# Patient Record
Sex: Male | Born: 1955
Health system: Southern US, Community
[De-identification: ages and names within clinical notes are randomized; demographics above are authoritative.]

## PROBLEM LIST (undated history)

## (undated) DIAGNOSIS — E785 Hyperlipidemia, unspecified: Secondary | ICD-10-CM

## (undated) DIAGNOSIS — K219 Gastro-esophageal reflux disease without esophagitis: Secondary | ICD-10-CM

## (undated) DIAGNOSIS — E0590 Thyrotoxicosis, unspecified without thyrotoxic crisis or storm: Secondary | ICD-10-CM

## (undated) HISTORY — PX: NO PAST SURGERIES: SHX2092

## (undated) HISTORY — DX: Hyperlipidemia, unspecified: E78.5

## (undated) HISTORY — DX: Thyrotoxicosis, unspecified without thyrotoxic crisis or storm: E05.90

## (undated) HISTORY — DX: Gastro-esophageal reflux disease without esophagitis: K21.9

---

## 2011-11-18 ENCOUNTER — Ambulatory Visit (INDEPENDENT_AMBULATORY_CARE_PROVIDER_SITE_OTHER): Payer: BC Managed Care – PPO | Admitting: Family Medicine

## 2011-11-18 VITALS — BP 114/72 | HR 60 | Temp 97.9°F | Resp 16 | Ht 67.0 in | Wt 178.0 lb

## 2011-11-18 DIAGNOSIS — E785 Hyperlipidemia, unspecified: Secondary | ICD-10-CM

## 2011-11-18 DIAGNOSIS — M2550 Pain in unspecified joint: Secondary | ICD-10-CM

## 2011-11-18 LAB — LIPID PANEL
Cholesterol: 199 mg/dL (ref 0–200)
HDL: 36 mg/dL — ABNORMAL LOW (ref >39)

## 2011-11-18 LAB — POCT SEDIMENTATION RATE: POCT SED RATE: 4 mm/hr (ref 0–22)

## 2011-11-18 MED ORDER — MELOXICAM 7.5 MG PO TABS: 7.5000 mg | ORAL_TABLET | Freq: Two times a day (BID) | ORAL | Status: DC

## 2011-11-18 NOTE — Progress Notes (Signed)
  Subjective:    Patient ID: Jason Swanson, male    DOB: Aug 20, 1956, 56 y.o.   MRN: [id number]  HPI 56 yo male here to have lipids rechecked.  Checked in Sept 2012 and were: Total 230, Trigs 201, HDL 40, LDL 150.  No other risk factors (no diabetes no hypertension) so lifestyle modification recommended and now here for recheck.   Did not change diet or activity levels.  Also complains of bilateral hand/finger joint pain for a year.  Takes occasional OTC med but can't tell me what it is.  Doesn't help.  Does labor job, does do a lot with hands.  Do not hurt all the time.  Mostly during work and especially after work.  Don't swell or turn red.  Denies joint pain in other areas.   Review of Systems    Negative except as per HPI  Objective:   Physical Exam  Constitutional: He appears well-developed and well-nourished.  Cardiovascular: Normal rate, regular rhythm, normal heart sounds and intact distal pulses.   No murmur heard. Pulmonary/Chest: Effort normal and breath sounds normal.  Neurological: He is alert.  Skin: Skin is warm and dry.   No redness, swelling, deforming, or tenderness of finger joint or hands.  Full grip strength.  FROM>        Assessment & Plan:  High Cholesterol - recheck lipids.  Joint pain - try mobic.  Check sed rate, RF, ANA

## 2012-03-25 ENCOUNTER — Ambulatory Visit (INDEPENDENT_AMBULATORY_CARE_PROVIDER_SITE_OTHER): Payer: BC Managed Care – PPO | Admitting: Family Medicine

## 2012-03-25 VITALS — BP 128/74 | HR 73 | Temp 98.8°F | Resp 16 | Ht 65.0 in | Wt 181.4 lb

## 2012-03-25 DIAGNOSIS — E782 Mixed hyperlipidemia: Secondary | ICD-10-CM

## 2012-03-25 DIAGNOSIS — Z0000 Encounter for general adult medical examination without abnormal findings: Secondary | ICD-10-CM

## 2012-03-25 DIAGNOSIS — E785 Hyperlipidemia, unspecified: Secondary | ICD-10-CM

## 2012-03-25 LAB — POCT URINALYSIS DIPSTICK
Leukocytes, UA: NEGATIVE
Nitrite, UA: NEGATIVE
Protein, UA: NEGATIVE
Spec Grav, UA: >=1.03
Urobilinogen, UA: 0.2
pH, UA: 6

## 2012-03-25 LAB — POCT CBC
HCT, POC: 47.6 % (ref 43.5–53.7)
Hemoglobin: 15.2 g/dL (ref 14.1–18.1)
Lymph, poc: 2.6 (ref 0.6–3.4)
MCH, POC: 29.6 pg (ref 27–31.2)
MCHC: 31.9 g/dL (ref 31.8–35.4)
POC LYMPH PERCENT: 37.1 %L (ref 10–50)
POC MID %: 7.3 %M (ref 0–12)
RDW, POC: 12.7 %
WBC: 7 10*3/uL (ref 4.6–10.2)

## 2012-03-25 LAB — COMPREHENSIVE METABOLIC PANEL
ALT: 17 U/L (ref 0–53)
AST: 17 U/L (ref 0–37)
Albumin: 4.7 g/dL (ref 3.5–5.2)
Alkaline Phosphatase: 56 U/L (ref 39–117)
Potassium: 3.9 mEq/L (ref 3.5–5.3)
Sodium: 141 mEq/L (ref 135–145)
Total Protein: 7.5 g/dL (ref 6.0–8.3)

## 2012-03-25 LAB — POCT UA - MICROSCOPIC ONLY
Casts, Ur, LPF, POC: NEGATIVE
Mucus, UA: NEGATIVE
Yeast, UA: NEGATIVE

## 2012-03-25 LAB — IFOBT (OCCULT BLOOD): IFOBT: NEGATIVE

## 2012-03-25 LAB — LIPID PANEL: Total CHOL/HDL Ratio: 6.5 Ratio

## 2012-03-25 NOTE — Progress Notes (Signed)
Subjective:    Patient ID: Jason Swanson, male    DOB: [birth year], 56 y.o.   MRN: [id number]  HPI Pt presents to clinic for CPE.  Needs a form for his work filled out for his [employer].  He has no complaints.  He had his tetatus updated last year.  He did not have a colonoscopy since his last visit.    See health hx - scanned.   Review of Systems  Constitutional: Negative.   HENT: Negative.   Eyes: Negative.   Respiratory: Negative.   Cardiovascular: Negative.   Gastrointestinal: Negative.   Genitourinary: Negative.   Musculoskeletal: Negative.   Skin: Negative.   Neurological: Negative.   Hematological: Negative.   Psychiatric/Behavioral: Negative.        Objective:   Physical Exam  Nursing note and vitals reviewed. Constitutional: He is oriented to person, place, and time. He appears well-developed and well-nourished.  HENT:  Head: Normocephalic and atraumatic.  Right Ear: Hearing, tympanic membrane, external ear and ear canal normal.  Left Ear: Hearing, tympanic membrane, external ear and ear canal normal.  Nose: Nose normal.  Mouth/Throat: Uvula is midline and oropharynx is clear and moist.  Eyes: Conjunctivae, EOM and lids are normal. Pupils are equal, round, and reactive to light.  Neck: Normal range of motion. Neck supple. No mass and no thyromegaly present.  Cardiovascular: Normal rate, regular rhythm, normal heart sounds and intact distal pulses.  Exam reveals no gallop and no friction rub.   No murmur heard. Pulmonary/Chest: Effort normal and breath sounds normal.  Abdominal: Soft. Normal appearance and bowel sounds are normal.  Genitourinary: Rectum normal, prostate normal, testes normal and penis normal. Circumcised. No penile tenderness.  Musculoskeletal: Normal range of motion.  Lymphadenopathy:    He has no cervical adenopathy.  Neurological: He is alert and oriented to person, place, and time. He has normal reflexes.  Skin: Skin is warm and  dry.  Psychiatric: He has a normal mood and affect. His behavior is normal. Judgment and thought content normal.    Results for orders placed in visit on 03/25/12  POCT CBC      Component Value Range   WBC 7.0  4.6 - 10.2 K/uL   Lymph, poc 2.6  0.6 - 3.4   POC LYMPH PERCENT 37.1  10 - 50 %L   MID (cbc) 0.5  0 - 0.9   POC MID % 7.3  0 - 12 %M   POC Granulocyte 3.9  2 - 6.9   Granulocyte percent 55.6  37 - 80 %G   RBC 5.13  4.69 - 6.13 M/uL   Hemoglobin 15.2  14.1 - 18.1 g/dL   HCT, POC 30.8  65.7 - 53.7 %   MCV 92.7  80 - 97 fL   MCH, POC 29.6  27 - 31.2 pg   MCHC 31.9  31.8 - 35.4 g/dL   RDW, POC 84.6     Platelet Count, POC 227  142 - 424 K/uL   MPV 10.0  0 - 99.8 fL  POCT URINALYSIS DIPSTICK      Component Value Range   Color, UA yellow     Clarity, UA clear     Glucose, UA net     Bilirubin, UA net     Ketones, UA net     Spec Grav, UA >=1.030     Blood, UA moderate     pH, UA 6.0     Protein, UA neg  Urobilinogen, UA 0.2     Nitrite, UA neg     Leukocytes, UA Negative    IFOBT (OCCULT BLOOD)      Component Value Range   IFOBT Negative      EKG - NSR without T wave changes.     Assessment & Plan:   1. Annual physical exam  POCT CBC, EKG 12-Lead, Lipid panel, Comprehensive metabolic panel, PSA, POCT urinalysis dipstick, IFOBT POC (occult bld, rslt in office)  2. Hyperlipidemia  Lipid panel, POCT urinalysis dipstick, IFOBT POC (occult bld, rslt in office)   Will schedule a colonoscopy.  Checked labs. Filled out paperwork for patient. Answered questions. Will monitor hematuria.

## 2012-03-26 ENCOUNTER — Encounter: Payer: Self-pay | Admitting: Physician Assistant

## 2012-04-21 ENCOUNTER — Ambulatory Visit (INDEPENDENT_AMBULATORY_CARE_PROVIDER_SITE_OTHER): Payer: BC Managed Care – PPO | Admitting: Emergency Medicine

## 2012-04-21 VITALS — BP 107/64 | HR 65 | Temp 97.9°F | Resp 16 | Ht 65.5 in | Wt 176.4 lb

## 2012-04-21 DIAGNOSIS — L723 Sebaceous cyst: Secondary | ICD-10-CM

## 2012-04-21 DIAGNOSIS — E781 Pure hyperglyceridemia: Secondary | ICD-10-CM

## 2012-04-21 LAB — COMPREHENSIVE METABOLIC PANEL
ALT: 15 U/L (ref 0–53)
AST: 17 U/L (ref 0–37)
Albumin: 4.6 g/dL (ref 3.5–5.2)
Alkaline Phosphatase: 55 U/L (ref 39–117)
BUN: 17 mg/dL (ref 6–23)
CO2: 26 mEq/L (ref 19–32)
Calcium: 9.4 mg/dL (ref 8.4–10.5)
Chloride: 105 mEq/L (ref 96–112)
Creat: 1.01 mg/dL (ref 0.50–1.35)
Glucose, Bld: 92 mg/dL (ref 70–99)
Potassium: 4.4 mEq/L (ref 3.5–5.3)
Sodium: 138 mEq/L (ref 135–145)
Total Bilirubin: 0.9 mg/dL (ref 0.3–1.2)
Total Protein: 7.3 g/dL (ref 6.0–8.3)

## 2012-04-21 LAB — LIPID PANEL
Cholesterol: 184 mg/dL (ref 0–200)
Total CHOL/HDL Ratio: 4.8 Ratio

## 2012-04-21 NOTE — Progress Notes (Signed)
  Subjective:    Patient ID: Jason Swanson, male    DOB: Nov 08, 1955, 56 y.o.   MRN: [id number]  HPI patient enters for recheck of high triglyceride and low hdl that was noted on his last blood draw. He has not been on medication. He has been watching his diet . He is in for repeat blood draw to he also has a cystic area on his mid back which has increased in size he would like to have checked.    Review of Systems     Objective:   Physical Exam his chest is clear to auscultation and percussion. His cardiac exam reveals a regular rate without murmurs rubs or gallops. The abdomen is soft and the liver and spleen are not enlarged There is a 2 x 2 centimeters cystic area over T12. This area is not fixed and freely movable and consistent with a sebaceous cyst       Assessment & Plan:  Repeat lipid panel and CMET are done today. I advised him we could open the cystic area on his back and we could just leave it alone

## 2012-04-21 NOTE — Progress Notes (Signed)
56 year old [place] male is here today to have his cholesterol levels checked and to have a bump that has been on his back for the last four to five months. Pt states he has no other complaints.

## 2012-08-04 ENCOUNTER — Ambulatory Visit (INDEPENDENT_AMBULATORY_CARE_PROVIDER_SITE_OTHER): Payer: BC Managed Care – PPO | Admitting: Physician Assistant

## 2012-08-04 VITALS — BP 114/68 | HR 62 | Temp 97.7°F | Resp 16 | Ht 65.58 in | Wt 179.4 lb

## 2012-08-04 DIAGNOSIS — E785 Hyperlipidemia, unspecified: Secondary | ICD-10-CM

## 2012-08-04 DIAGNOSIS — Z23 Encounter for immunization: Secondary | ICD-10-CM

## 2012-08-04 LAB — LIPID PANEL
Cholesterol: 199 mg/dL (ref 0–200)
HDL: 33 mg/dL — ABNORMAL LOW (ref >39)
LDL Cholesterol: 120 mg/dL — ABNORMAL HIGH (ref 0–99)
Total CHOL/HDL Ratio: 6 Ratio
Triglycerides: 231 mg/dL — ABNORMAL HIGH (ref <150)
VLDL: 46 mg/dL — ABNORMAL HIGH (ref 0–40)

## 2012-08-04 LAB — COMPREHENSIVE METABOLIC PANEL
ALT: 23 U/L (ref 0–53)
AST: 17 U/L (ref 0–37)
Alkaline Phosphatase: 57 U/L (ref 39–117)
BUN: 15 mg/dL (ref 6–23)
Calcium: 9.5 mg/dL (ref 8.4–10.5)
Chloride: 105 mEq/L (ref 96–112)
Creat: 0.84 mg/dL (ref 0.50–1.35)
Total Bilirubin: 1.2 mg/dL (ref 0.3–1.2)

## 2012-08-04 NOTE — Progress Notes (Addendum)
  Subjective:    Patient ID: Jason Swanson, male    DOB: Jan 01, 1956, 56 y.o.   MRN: [id number]  HPI Presents for re-evaluation of hyperlipidemia.  It was noted to be elevated in March of this year, and he's been working on lifestyle modification with some improvement.  He has never been on medication for lipid lowering.   Past Medical History  Diagnosis Date  . Hyperlipidemia     History reviewed. No pertinent past surgical history.  Prior to Admission medications   Not on File    No Known Allergies  History   Social History  . Marital Status: Married    Spouse Name: [name]    Number of Children: 6  . Years of Education: 16   Occupational History  . OPERATOR    Social History Main Topics  . Smoking status: Never Smoker   . Smokeless tobacco: Never Used  . Alcohol Use: No  . Drug Use: No  . Sexually Active: Yes -- Male partner(s)   Other Topics Concern  . Not on file   Social History Narrative   Lives with his wife and two younger sons.  Older children live in [place].  He is from [place], and came to the [place] in 2010.    Family History  Problem Relation Age of Onset  . Hypertension Father     Review of Systems Constipation. No chest pain, SOB, HA, dizziness, vision change, N/V, diarrhea, dysuria, urinary urgency or frequency, myalgias, arthralgias or rash.     Objective:   Physical Exam  Blood pressure 114/68, pulse 62, temperature 97.7 F (36.5 C), temperature source Oral, resp. rate 16, height 5' 5.58" (1.666 m), weight 179 lb 6.4 oz (81.375 kg), SpO2 99.00%. Body mass index is 29.33 kg/(m^2). Well-developed, well nourished [place] man who is awake, alert and oriented, in NAD. HEENT: [place]/AT, sclera and conjunctiva are clear.   Neck: supple, non-tender, no lymphadenopathy, thyromegaly. Heart: RRR, no murmur Lungs: normal effort, CTA Extremities: no cyanosis, clubbing or edema. Skin: warm and dry. Psychologic: good mood and appropriate  affect, normal speech and behavior.     Assessment & Plan:   1. Hyperlipidemia  Lipid panel, Comprehensive metabolic panel  2. Need for influenza vaccination  Flu vaccine greater than or equal to 3yo preservative free IM   F/U 3-6 months, depending on lab results.

## 2012-08-06 ENCOUNTER — Encounter: Payer: Self-pay | Admitting: Physician Assistant

## 2012-08-06 MED ORDER — PRAVASTATIN SODIUM 20 MG PO TABS: 20.0000 mg | ORAL_TABLET | Freq: Every day | ORAL | Status: DC

## 2012-08-06 NOTE — Addendum Note (Signed)
Addended by: Fernande Bras on: 08/06/2012 01:42 PM   Modules accepted: Orders

## 2012-10-22 ENCOUNTER — Ambulatory Visit (INDEPENDENT_AMBULATORY_CARE_PROVIDER_SITE_OTHER): Payer: BC Managed Care – PPO | Admitting: Family Medicine

## 2012-10-22 VITALS — BP 133/76 | HR 83 | Temp 98.0°F | Resp 16 | Ht 66.0 in | Wt 186.0 lb

## 2012-10-22 DIAGNOSIS — L02219 Cutaneous abscess of trunk, unspecified: Secondary | ICD-10-CM

## 2012-10-22 DIAGNOSIS — L02212 Cutaneous abscess of back [any part, except buttock]: Secondary | ICD-10-CM

## 2012-10-22 DIAGNOSIS — L723 Sebaceous cyst: Secondary | ICD-10-CM

## 2012-10-22 DIAGNOSIS — L089 Local infection of the skin and subcutaneous tissue, unspecified: Secondary | ICD-10-CM

## 2012-10-22 MED ORDER — DOXYCYCLINE HYCLATE 100 MG PO TABS: 100.0000 mg | ORAL_TABLET | Freq: Two times a day (BID) | ORAL | Status: DC

## 2012-10-22 NOTE — Progress Notes (Signed)
Consent obtained:  Local anesthesia with 2% lido with epi.  #11 blade used to make 1.5 cm incision.  Purulent sebaceous material was expressed.  Packed with 1/4 in plain packing.  Drsg placed.  Wound care d/w pt.

## 2012-10-22 NOTE — Patient Instructions (Addendum)
Take doxycycline 100 mg one twice daily for infection Change bandage daily.   Recheck with Maralyn Sago on Thursday or Friday for wound recheck.  Bring in your fast pass.

## 2012-10-22 NOTE — Progress Notes (Signed)
Subjective: 57 year old gentleman from [place] who has been in [place] for 3 years. He is working a labor job. He's developed a abscessed sebaceous cyst on his back. For 56 days has gotten worse. It has not been draining.  Objective To a half to 3 cm nodule in the mid back right overlying the spine. Inflamed and tender, coming to a head  Assessment: Abscessed sebaceous cyst  Plan: Doxycycline 100 mg twice a day I&D of cyst Culture of cyst

## 2012-12-15 ENCOUNTER — Ambulatory Visit: Payer: BC Managed Care – PPO | Admitting: Emergency Medicine

## 2012-12-15 VITALS — BP 107/62 | HR 58 | Temp 97.5°F | Resp 16 | Ht 66.0 in | Wt 183.0 lb

## 2012-12-15 DIAGNOSIS — E782 Mixed hyperlipidemia: Secondary | ICD-10-CM

## 2012-12-15 LAB — POCT CBC
Hemoglobin: 17.2 g/dL (ref 14.1–18.1)
Lymph, poc: 2.6 (ref 0.6–3.4)
MCH, POC: 29.7 pg (ref 27–31.2)
MCHC: 32.8 g/dL (ref 31.8–35.4)
MID (cbc): 0.6 (ref 0–0.9)
MPV: 9.3 fL (ref 0–99.8)
POC Granulocyte: 3.9 (ref 2–6.9)
POC LYMPH PERCENT: 36.6 %L (ref 10–50)
POC MID %: 8.1 %M (ref 0–12)
RDW, POC: 13.1 %
WBC: 7.1 10*3/uL (ref 4.6–10.2)

## 2012-12-15 LAB — COMPREHENSIVE METABOLIC PANEL
ALT: 24 U/L (ref 0–53)
Alkaline Phosphatase: 58 U/L (ref 39–117)
CO2: 28 mEq/L (ref 19–32)
Creat: 0.97 mg/dL (ref 0.50–1.35)
Sodium: 140 mEq/L (ref 135–145)
Total Bilirubin: 1.5 mg/dL — ABNORMAL HIGH (ref 0.3–1.2)
Total Protein: 8.1 g/dL (ref 6.0–8.3)

## 2012-12-15 LAB — LIPID PANEL
Cholesterol: 216 mg/dL — ABNORMAL HIGH (ref 0–200)
LDL Cholesterol: 134 mg/dL — ABNORMAL HIGH (ref 0–99)
Total CHOL/HDL Ratio: 5.3 Ratio
Triglycerides: 204 mg/dL — ABNORMAL HIGH (ref <150)
VLDL: 41 mg/dL — ABNORMAL HIGH (ref 0–40)

## 2012-12-15 NOTE — Progress Notes (Signed)
[facility] [address], [place] [address] [phone]  Date:  12/15/2012   Name:  Jason Swanson   DOB:  July 07, 1956   MRN:  [id number]  PCP:  No primary provider on file.    Chief Complaint: Hyperlipidemia   History of Present Illness:  Jason Swanson is a 57 y.o. very pleasant male patient who presents with the following:  History of hyperlipidemia and was on pravachol.  Ran out of the medication a couple weeks ago and is now off the medication and wants to know if his hyperlipidemia is "cured" or does he need another round of medication.  Denies other complaint or health concern today.   Patient Active Problem List  Diagnosis  . Hyperlipidemia    Past Medical History  Diagnosis Date  . Hyperlipidemia     History reviewed. No pertinent past surgical history.  History  Substance Use Topics  . Smoking status: Never Smoker   . Smokeless tobacco: Never Used  . Alcohol Use: No    Family History  Problem Relation Age of Onset  . Hypertension Father     No Known Allergies  Medication list has been reviewed and updated.  Current Outpatient Prescriptions on File Prior to Visit  Medication Sig Dispense Refill  . pravastatin (PRAVACHOL) 20 MG tablet Take 1 tablet (20 mg total) by mouth daily.  90 tablet  3  . doxycycline (VIBRA-TABS) 100 MG tablet Take 1 tablet (100 mg total) by mouth 2 (two) times daily.  20 tablet  0   No current facility-administered medications on file prior to visit.    Review of Systems:  As per HPI, otherwise negative.    Physical Examination: Filed Vitals:   12/15/12 1126  BP: 107/62  Pulse: 58  Temp: 97.5 F (36.4 C)  Resp: 16   Filed Vitals:   12/15/12 1126  Height: 5\' 6"  (1.676 m)  Weight: 183 lb (83.008 kg)   Body mass index is 29.55 kg/(m^2). Ideal Body Weight: Weight in (lb) to have BMI = 25: 154.6   GEN: WDWN, NAD, Non-toxic, A & O x 3 HEENT: Atraumatic, Normocephalic. Neck supple. No  masses, No LAD. Ears and Nose: No external deformity. CV: RRR, No M/G/R. No JVD. No thrill. No extra heart sounds. PULM: CTA B, no wheezes, crackles, rhonchi. No retractions. No resp. distress. No accessory muscle use. ABD: S, NT, ND, +BS. No rebound. No HSM. EXTR: No c/c/e NEURO Normal gait.  PSYCH: Normally interactive. Conversant. Not depressed or anxious appearing.  Calm demeanor.   Assessment and Plan: Hyperlipidemia Overweight   Signed,  Phillips Odor, MD

## 2012-12-15 NOTE — Patient Instructions (Addendum)

## 2012-12-16 ENCOUNTER — Other Ambulatory Visit: Payer: Self-pay | Admitting: *Deleted

## 2012-12-16 MED ORDER — PRAVASTATIN SODIUM 20 MG PO TABS: 20.0000 mg | ORAL_TABLET | Freq: Every day | ORAL | Status: DC

## 2012-12-16 NOTE — Addendum Note (Signed)
Addended by: Carmelina Dane on: 12/16/2012 08:58 AM   Modules accepted: Orders

## 2012-12-24 ENCOUNTER — Telehealth: Payer: Self-pay

## 2012-12-24 NOTE — Telephone Encounter (Signed)
Called patient what is the doxycycline for? Left message for call back, do not see any indication he needs Doxy.

## 2012-12-24 NOTE — Telephone Encounter (Signed)
Called again, what is he requesting Doxycycline for?

## 2012-12-24 NOTE — Telephone Encounter (Signed)
Went to p/u medication and only one rx was there. Needs to get doxycycline. Can you send again?  [facility] on [address]    [phone]

## 2012-12-26 NOTE — Telephone Encounter (Signed)
Patient not returning my calls he does not need doxy, this was old rx, listed on papers. This can be confusing.

## 2013-10-12 ENCOUNTER — Ambulatory Visit (INDEPENDENT_AMBULATORY_CARE_PROVIDER_SITE_OTHER): Payer: PRIVATE HEALTH INSURANCE | Admitting: Physician Assistant

## 2013-10-12 ENCOUNTER — Other Ambulatory Visit: Payer: Self-pay | Admitting: Physician Assistant

## 2013-10-12 VITALS — BP 110/68 | HR 68 | Temp 98.2°F | Resp 18 | Ht 66.0 in | Wt 184.0 lb

## 2013-10-12 DIAGNOSIS — E785 Hyperlipidemia, unspecified: Secondary | ICD-10-CM

## 2013-10-12 LAB — COMPREHENSIVE METABOLIC PANEL
ALK PHOS: 54 U/L (ref 39–117)
ALT: 22 U/L (ref 0–53)
AST: 19 U/L (ref 0–37)
Albumin: 4.6 g/dL (ref 3.5–5.2)
BILIRUBIN TOTAL: 1.5 mg/dL — AB (ref 0.2–1.2)
BUN: 14 mg/dL (ref 6–23)
CO2: 29 mEq/L (ref 19–32)
CREATININE: 1.07 mg/dL (ref 0.50–1.35)
Calcium: 9.3 mg/dL (ref 8.4–10.5)
Chloride: 104 mEq/L (ref 96–112)
GLUCOSE: 103 mg/dL — AB (ref 70–99)
Potassium: 4.2 mEq/L (ref 3.5–5.3)
SODIUM: 140 meq/L (ref 135–145)
TOTAL PROTEIN: 7.5 g/dL (ref 6.0–8.3)

## 2013-10-12 LAB — LIPID PANEL
CHOL/HDL RATIO: 4.6 ratio
Cholesterol: 157 mg/dL (ref 0–200)
HDL: 34 mg/dL — AB (ref >39)
LDL Cholesterol: 86 mg/dL (ref 0–99)
Triglycerides: 185 mg/dL — ABNORMAL HIGH (ref <150)
VLDL: 37 mg/dL (ref 0–40)

## 2013-10-12 LAB — LDL CHOLESTEROL, DIRECT: LDL DIRECT: 98 mg/dL

## 2013-10-12 MED ORDER — PRAVASTATIN SODIUM 20 MG PO TABS: 20.0000 mg | ORAL_TABLET | Freq: Every day | ORAL | Status: DC

## 2013-10-12 NOTE — Progress Notes (Signed)
   Subjective:    Patient ID: Jason Swanson, male    DOB: November 06, 1955, 58 y.o.   MRN: [id number]  HPI Primary Physician: No primary provider on file.  Chief Complaint: Follow up hyperlipidemia  HPI: 58 y.o. male with history below presents for follow up hyperlipidemia. Currently taking pravastatin 20 mg nightly. No muscle aches or body aches from the medication. Needs refill. Has been on this medication since 2013. Diet consists of mix some healthy foods and some fast foods. He does not get any exercise. His exercise consists of his work. He is trying to start some exercise routine. No family history of hyperlipidemia that he knows of.    Past Medical History  Diagnosis Date  . Hyperlipidemia      Home Meds: Prior to Admission medications   Medication Sig Start Date End Date Taking? Authorizing Provider  pravastatin (PRAVACHOL) 20 MG tablet Take 1 tablet (20 mg total) by mouth daily. 12/16/12  Yes Phillips OdorJeffery Anderson, MD    Allergies: No Known Allergies  History   Social History  . Marital Status: Married    Spouse Name: [name]    Number of Children: 6  . Years of Education: 16   Occupational History  . OPERATOR    Social History Main Topics  . Smoking status: Never Smoker   . Smokeless tobacco: Never Used  . Alcohol Use: No  . Drug Use: No  . Sexual Activity: Yes    Partners: Female   Other Topics Concern  . Not on file   Social History Narrative   Lives with his wife and two younger sons.  Older children live in [place].  He is from [place], and came to the [place] in 2010.     Review of Systems     Objective:   Physical Exam  Physical Exam: Blood pressure 110/68, pulse 68, temperature 98.2 F (36.8 C), temperature source Oral, resp. rate 18, height 5\' 6"  (1.676 m), weight 184 lb (83.462 kg), SpO2 98.00%., Body mass index is 29.71 kg/(m^2). General: Well developed, well nourished, in no acute distress. Head: Normocephalic, atraumatic, eyes without  discharge, sclera non-icteric, nares are without discharge. Bilateral auditory canals clear, TM's are without perforation, pearly grey and translucent with reflective cone of light bilaterally. Oral cavity moist, posterior pharynx without exudate, erythema, peritonsillar abscess, or post nasal drip. Uvula midline.   Neck: Supple. No thyromegaly. Full ROM. No lymphadenopathy. Lungs: Clear bilaterally to auscultation without wheezes, rales, or rhonchi. Breathing is unlabored. Heart: RRR with S1 S2. No murmurs, rubs, or gallops appreciated. Abdomen: Soft, non-tender, non-distended with normoactive bowel sounds. No hepatosplenomegaly. No rebound/guarding. No obvious abdominal masses. Msk:  Strength and tone normal for age. Extremities/Skin: Warm and dry. No clubbing or cyanosis. No edema. No rashes or suspicious lesions. Neuro: Alert and oriented X 3. Moves all extremities spontaneously. Gait is normal. CNII-XII grossly in tact. Psych:  Responds to questions appropriately with a normal affect.    Labs:  Lipid, direct LDL, and CMP pending. He is not currently fasting.      Assessment & Plan:  58 year old male with hyperlipidemia  -Await labs -Refill medication based on lipid panel -Healthy diet and exercise -Weight loss -Follow up 6 months   Eula Listenyan Tomicka Lover, MHS, PA-C [facility] [address] [place], [place] [address] [phone] [facility] 10/12/2013 8:52 AM

## 2013-12-27 ENCOUNTER — Emergency Department (HOSPITAL_COMMUNITY)
Admission: EM | Admit: 2013-12-27 | Discharge: 2013-12-27 | Disposition: A | Discharge status: Home / Self Care | Payer: Worker's Compensation | Attending: Emergency Medicine | Admitting: Emergency Medicine

## 2013-12-27 ENCOUNTER — Encounter (HOSPITAL_COMMUNITY): Payer: Self-pay | Admitting: Emergency Medicine

## 2013-12-27 DIAGNOSIS — Z79899 Other long term (current) drug therapy: Secondary | ICD-10-CM | POA: Insufficient documentation

## 2013-12-27 DIAGNOSIS — E785 Hyperlipidemia, unspecified: Secondary | ICD-10-CM | POA: Insufficient documentation

## 2013-12-27 DIAGNOSIS — IMO0002 Reserved for concepts with insufficient information to code with codable children: Secondary | ICD-10-CM | POA: Insufficient documentation

## 2013-12-27 DIAGNOSIS — X500XXA Overexertion from strenuous movement or load, initial encounter: Secondary | ICD-10-CM | POA: Insufficient documentation

## 2013-12-27 DIAGNOSIS — Y9289 Other specified places as the place of occurrence of the external cause: Secondary | ICD-10-CM | POA: Insufficient documentation

## 2013-12-27 DIAGNOSIS — Y9389 Activity, other specified: Secondary | ICD-10-CM | POA: Insufficient documentation

## 2013-12-27 DIAGNOSIS — S86119A Strain of other muscle(s) and tendon(s) of posterior muscle group at lower leg level, unspecified leg, initial encounter: Secondary | ICD-10-CM

## 2013-12-27 MED ORDER — HYDROCODONE-ACETAMINOPHEN 7.5-325 MG PO TABS: 1.0000 | ORAL_TABLET | ORAL | Status: DC | PRN

## 2013-12-27 MED ORDER — MELOXICAM 7.5 MG PO TABS: ORAL_TABLET | ORAL | Status: DC

## 2013-12-27 MED ORDER — KETOROLAC TROMETHAMINE 10 MG PO TABS
10.0000 mg | ORAL_TABLET | Freq: Once | ORAL | Status: AC
  Administered 2013-12-27: 10 mg via ORAL
  Filled 2013-12-27: qty 1

## 2013-12-27 NOTE — Discharge Instructions (Signed)
Your examination is consistent with a strain of your calf muscle. Please rest your leg is much as possible. Please elevate it and your sitting or lying down. Please apply an ice pack. Please use her crutches until you can safely apply weight to your lower extremity. Please see the orthopedic specialist listed above for additional evaluation and management of this injury. Use Mobic 2 times daily with food, use Norco every 4 hours if needed for pain, this medication may cause drowsiness, please use with caution.

## 2013-12-27 NOTE — ED Notes (Signed)
Pt states he was pushing box at work when he felt pain in L. Lower calf. States very painful when walking and tender to touch.

## 2013-12-27 NOTE — ED Notes (Signed)
Patient c/o left calf pain that started suddenly after pushing large heavy boxes at work. Patient ambulated to triage but limping noted.

## 2013-12-27 NOTE — ED Provider Notes (Signed)
CSN: [id number]     Arrival date & time 12/27/13  1346 History   First MD Initiated Contact with Patient 12/27/13 1436     Chief Complaint  Patient presents with  . Leg Pain     (Consider location/radiation/quality/duration/timing/severity/associated sxs/prior Treatment) Patient is a 58 y.o. male presenting with leg pain. The history is provided by the patient.  Leg Pain Location:  Leg Time since incident:  1 hour Injury: yes   Mechanism of injury comment:  Pt injured the left calf while pushing heavy boxes at work. NO other injury reported. Leg pain location: calf of the left leg. Pain details:    Quality:  Cramping and sharp   Radiates to:  Does not radiate   Severity:  Moderate   Onset quality:  Sudden   Duration:  1 hour   Timing:  Constant   Progression:  Worsening Chronicity:  New Dislocation: no   Foreign body present:  No foreign bodies Prior injury to area:  No Relieved by:  Nothing Ineffective treatments:  None tried Associated symptoms: no back pain, no neck pain and no numbness   Risk factors: no frequent fractures     Past Medical History  Diagnosis Date  . Hyperlipidemia    History reviewed. No pertinent past surgical history. Family History  Problem Relation Age of Onset  . Hypertension Father    History  Substance Use Topics  . Smoking status: Never Smoker   . Smokeless tobacco: Never Used  . Alcohol Use: No    Review of Systems  Constitutional: Negative for activity change.       All ROS Neg except as noted in HPI  HENT: Negative for nosebleeds.   Eyes: Negative for photophobia and discharge.  Respiratory: Negative for cough, shortness of breath and wheezing.   Cardiovascular: Negative for chest pain and palpitations.  Gastrointestinal: Negative for abdominal pain and blood in stool.  Genitourinary: Negative for dysuria, frequency and hematuria.  Musculoskeletal: Negative for arthralgias, back pain and neck pain.  Skin: Negative.    Neurological: Negative for dizziness, seizures and speech difficulty.  Psychiatric/Behavioral: Negative for hallucinations and confusion.      Allergies  Review of patient's allergies indicates no known allergies.  Home Medications   Prior to Admission medications   Medication Sig Start Date End Date Taking? Authorizing Provider  pravastatin (PRAVACHOL) 20 MG tablet Take 1 tablet (20 mg total) by mouth daily. 10/12/13  Yes Ryan M Dunn, PA-C   BP 126/74  Pulse 79  Temp(Src) 98 F (36.7 C) (Oral)  Resp 18  Ht 5\' 4"  (1.626 m)  Wt 184 lb (83.462 kg)  BMI 31.57 kg/m2  SpO2 99% Physical Exam  Nursing note and vitals reviewed. Constitutional: He is oriented to person, place, and time. He appears well-developed and well-nourished.  Non-toxic appearance.  HENT:  Head: Normocephalic.  Right Ear: Tympanic membrane and external ear normal.  Left Ear: Tympanic membrane and external ear normal.  Eyes: EOM and lids are normal. Pupils are equal, round, and reactive to light.  Neck: Normal range of motion. Neck supple. Carotid bruit is not present.  Cardiovascular: Normal rate, regular rhythm, normal heart sounds, intact distal pulses and normal pulses.   Pulmonary/Chest: Breath sounds normal. No respiratory distress.  Abdominal: Soft. Bowel sounds are normal. There is no tenderness. There is no guarding.  Musculoskeletal:       Left lower leg: He exhibits tenderness. He exhibits no swelling and no deformity.  Neg Thompson's sign -  left calf. DP 2+. Cap refill less than 2 sec. FROM of the right and left knee, and hip.  Lymphadenopathy:       Head (right side): No submandibular adenopathy present.       Head (left side): No submandibular adenopathy present.    He has no cervical adenopathy.  Neurological: He is alert and oriented to person, place, and time. He has normal strength. No cranial nerve deficit or sensory deficit.  Skin: Skin is warm and dry.  Psychiatric: He has a normal mood  and affect. His speech is normal.    ED Course  Procedures (including critical care time) Labs Review Labs Reviewed - No data to display  Imaging Review No results found.   EKG Interpretation None      MDM No evidence for fracture or dislocation of the left lower extremity. The achilles Tendon is intact. No hematoma of the left calf, or redness. No neurovascular changes noted.    Final diagnoses:  None    *I have reviewed nursing notes, vital signs, and all appropriate lab and imaging results for this patient.Kathie Dike**    Melaine Mcphee M Nolah Krenzer, PA-C 12/28/13 2019

## 2013-12-29 NOTE — ED Provider Notes (Signed)
Medical screening examination/treatment/procedure(s) were performed by non-physician practitioner and as supervising physician I was immediately available for consultation/collaboration.   EKG Interpretation None       Mackenzy Eisenberg L Nasiya Pascual, MD 12/29/13 1800 

## 2014-08-10 ENCOUNTER — Ambulatory Visit (INDEPENDENT_AMBULATORY_CARE_PROVIDER_SITE_OTHER): Payer: PRIVATE HEALTH INSURANCE | Admitting: Physician Assistant

## 2014-08-10 VITALS — BP 122/66 | HR 60 | Temp 97.8°F | Resp 18 | Ht 66.5 in | Wt 187.0 lb

## 2014-08-10 DIAGNOSIS — Z131 Encounter for screening for diabetes mellitus: Secondary | ICD-10-CM

## 2014-08-10 DIAGNOSIS — E785 Hyperlipidemia, unspecified: Secondary | ICD-10-CM

## 2014-08-10 LAB — LIPID PANEL
CHOL/HDL RATIO: 4.5 ratio
Cholesterol: 199 mg/dL (ref 0–200)
HDL: 44 mg/dL (ref >39)
LDL Cholesterol: 119 mg/dL — ABNORMAL HIGH (ref 0–99)
Triglycerides: 182 mg/dL — ABNORMAL HIGH (ref <150)
VLDL: 36 mg/dL (ref 0–40)

## 2014-08-10 LAB — GLUCOSE, POCT (MANUAL RESULT ENTRY): POC GLUCOSE: 94 mg/dL (ref 70–99)

## 2014-08-10 MED ORDER — PRAVASTATIN SODIUM 20 MG PO TABS: 20.0000 mg | ORAL_TABLET | Freq: Every day | ORAL | Status: DC

## 2014-08-10 NOTE — Progress Notes (Signed)
IDENTIFYING INFORMATION  Marva PandaGirma W Pinard / DOB: [birth year] / MRN: [id number]  The patient has Hyperlipidemia on his problem list.  SUBJECTIVE  Chief Complaint: Labs Only   History of present illness: Mr. Janeece Riggersstifanos is a 58 y.o. year old male who presents for a fasting cholesterol check and a diabetes screen.  He has no complaints today. He is fasting at this time.  He has been taking his pravastatin for roughly 1 year. He denies SOB, chest pain, palpitations, nausea, and emesis.  He would like to come off of this medication should his labs indicate this possibility.    He repots having occasional heart burn with eating sour and fatty foods, and gets good relief with Tums.   He was advised on the need for an annual screening, but declines this today d/t cost.      He  has a past medical history of Hyperlipidemia.    He has a current medication list which includes the following prescription(s): pravastatin.  Mr. Janeece Riggersstifanos has No Known Allergies. He  reports that he has never smoked. He has never used smokeless tobacco. He reports that he does not drink alcohol or use illicit drugs. He  reports that he currently engages in sexual activity and has had male partners.  The patient  has no past surgical history on file.  His family history includes Hypertension in his father.  Review of Systems  Constitutional: Negative for fever, chills and weight loss.  Eyes: Negative for blurred vision.  Respiratory: Negative for cough and wheezing.   Gastrointestinal: Negative for abdominal pain, diarrhea, constipation, blood in stool and melena.  Genitourinary: Negative for dysuria, urgency and frequency.  Musculoskeletal: Negative for myalgias.  Skin: Negative for itching and rash.  Neurological: Negative for dizziness and headaches.  Psychiatric/Behavioral: Negative for depression.    OBJECTIVE  Blood pressure 122/66, pulse 60, temperature 97.8 F (36.6 C), temperature source Oral,  resp. rate 18, height 5' 6.5" (1.689 m), weight 187 lb (84.823 kg), SpO2 99 %. The patient's body mass index is 29.73 kg/(m^2).  Physical Exam  Constitutional: He is oriented to person, place, and time. He appears well-developed and well-nourished. No distress.  Eyes: EOM are normal.  Neck: Normal range of motion.  Cardiovascular: Normal rate, regular rhythm and normal heart sounds.   Respiratory: Effort normal and breath sounds normal.  GI: Soft. Bowel sounds are normal. He exhibits no distension. There is no hepatosplenomegaly. There is no tenderness.  Musculoskeletal: Normal range of motion.  Neurological: He is alert and oriented to person, place, and time. He has normal reflexes. No cranial nerve deficit.  Skin: Skin is warm and dry. He is not diaphoretic.  Psychiatric: He has a normal mood and affect. His behavior is normal. Judgment and thought content normal.    Lab Results  Component Value Date   CHOL 199 08/10/2014   HDL 44 08/10/2014   LDLCALC 119* 08/10/2014   LDLDIRECT 98 10/12/2013   TRIG 182* 08/10/2014   CHOLHDL 4.5 08/10/2014     No results found for this or any previous visit (from the past 24 hour(s)).  ASSESSMENT & PLAN  Verne GrainGirma was seen today for labs only.  Diagnoses and associated orders for this visit:  Screening for diabetes mellitus (DM) - POCT glucose (manual entry)  Hyperlipidemia - Lipid panel -     Patient would like to decrease of stop taking pravastatin. Patient amenable to eating less sugar and eating fish twice weekly. He was educated  with regard to exercising daily.      The patient was instructed to to call or comeback to clinic as needed, or should symptoms warrant.  Deliah BostonMichael Clark, MHS, PA-C [facility] 08/11/2014 11:00 AM

## 2014-08-10 NOTE — Patient Instructions (Addendum)
Exercise improves every system in the body.  It lowers the risk of heart disease, decreases blood pressure, reduces the symptoms of depression and anxiety, and lowers blood sugar. To receive these benefits, try to get 150 minutes of planned exercise each week.  You can break this 150 minutes up however you like.  For instance, you can perform 30 minutes of brisk walking 5 days a week, or perform 50 minutes 3 days a week.  If you don't like walking, or can't find a safe place to walk, find another way to move that you can enjoy.  Exercise tapes, cycling, stair climbing, swimming, or a combination will be just as good as a walking program. To ensure the proper intensity, you can use the talk test. Essentially, you should be able to carry on a conversation, but you should have to take short breaks from the conversation in order catch your breath.  Try to eat fish twice a week, or take a fish oil product over the counter daily. Speak to the pharmacist about which would be appropriate for you.    Take Ranitidine, 75 mg before eating foods known to cause heartburn.   Keeping you healthy  Get these tests  Blood pressure- Have your blood pressure checked once a year by your healthcare provider.  Normal blood pressure is 120/80  Weight- Have your body mass index (BMI) calculated to screen for obesity.  BMI is a measure of body fat based on height and weight. You can also calculate your own BMI at [email or web address].nhl[employer]ort.com/bmi/.  Cholesterol- Have your cholesterol checked every year.  Diabetes- Have your blood sugar checked regularly if you have high blood pressure, high cholesterol, have a family history of diabetes or if you are overweight.  Screening for Colon Cancer- Colonoscopy starting at age 57.  Screening may begin sooner depending on your family history and other health conditions. Follow up colonoscopy as directed by your Gastroenterologist.  Screening for Prostate Cancer- Both blood work (PSA) and a  rectal exam help screen for Prostate Cancer.  Screening begins at age 57 with African-American men and at age 57 with Caucasian men.  Screening may begin sooner depending on your family history.  Take these medicines  Aspirin- One aspirin daily can help prevent Heart disease and Stroke.  Flu shot- Every fall.  Tetanus- Every 10 years.  Zostavax- Once after the age of 57 to prevent Shingles.  Pneumonia shot- Once after the age of 57; if you are younger than 1965, ask your healthcare provider if you need a Pneumonia shot.  Take these steps  Don't smoke- If you do smoke, talk to your doctor about quitting.  For tips on how to quit, go to www.smokefree.gov or call 1-800-QUIT-NOW.  Be physically active- Exercise 5 days a week for at least 30 minutes.  If you are not already physically active start slow and gradually work up to 30 minutes of moderate physical activity.  Examples of moderate activity include walking briskly, mowing the yard, dancing, swimming, bicycling, etc.  Eat a healthy diet- Eat a variety of healthy food such as fruits, vegetables, low fat milk, low fat cheese, yogurt, lean meant, poultry, fish, beans, tofu, etc. For more information go to www.thenutritionsource.org  Drink alcohol in moderation- Limit alcohol intake to less than two drinks a day. Never drink and drive.  Dentist- Brush and floss twice daily; visit your dentist twice a year.  Depression- Your emotional health is as important as your physical health. If you're feeling  down, or losing interest in things you would normally enjoy please talk to your healthcare provider.  Eye exam- Visit your eye doctor every year.  Safe sex- If you may be exposed to a sexually transmitted infection, use a condom.  Seat belts- Seat belts can save your life; always wear one.  Smoke/Carbon Monoxide detectors- These detectors need to be installed on the appropriate level of your home.  Replace batteries at least once a  year.  Skin cancer- When out in the sun, cover up and use sunscreen 15 SPF or higher.  Violence- If anyone is threatening you, please tell your healthcare provider.  Living Will/ Health care power of attorney- Speak with your healthcare provider and family.

## 2014-08-13 ENCOUNTER — Telehealth: Payer: Self-pay

## 2014-08-13 DIAGNOSIS — E785 Hyperlipidemia, unspecified: Secondary | ICD-10-CM

## 2014-08-13 MED ORDER — PRAVASTATIN SODIUM 20 MG PO TABS: 20.0000 mg | ORAL_TABLET | Freq: Every day | ORAL | Status: DC

## 2014-08-13 NOTE — Telephone Encounter (Signed)
Pt needs a RF of his chol med. Sent in and notified of labs

## 2014-08-17 NOTE — Progress Notes (Signed)
I have discussed this case with Mr. Clark, PA-C and agree.  

## 2015-10-15 ENCOUNTER — Other Ambulatory Visit: Payer: Self-pay | Admitting: Physician Assistant

## 2015-10-15 ENCOUNTER — Ambulatory Visit (INDEPENDENT_AMBULATORY_CARE_PROVIDER_SITE_OTHER): Payer: Self-pay | Admitting: Physician Assistant

## 2015-10-15 DIAGNOSIS — M545 Low back pain: Secondary | ICD-10-CM

## 2015-10-15 DIAGNOSIS — M25512 Pain in left shoulder: Secondary | ICD-10-CM

## 2015-10-15 MED ORDER — CYCLOBENZAPRINE HCL 10 MG PO TABS: 10.0000 mg | ORAL_TABLET | Freq: Three times a day (TID) | ORAL | Status: DC | PRN

## 2015-10-15 MED ORDER — NAPROXEN 500 MG PO TABS: 500.0000 mg | ORAL_TABLET | Freq: Two times a day (BID) | ORAL | Status: DC
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
## 2015-10-21 ENCOUNTER — Ambulatory Visit (INDEPENDENT_AMBULATORY_CARE_PROVIDER_SITE_OTHER): Payer: Self-pay

## 2015-10-21 ENCOUNTER — Ambulatory Visit (INDEPENDENT_AMBULATORY_CARE_PROVIDER_SITE_OTHER): Payer: Self-pay | Admitting: Physician Assistant

## 2015-10-21 VITALS — BP 120/72 | HR 82 | Temp 97.9°F | Resp 19 | Wt 186.2 lb

## 2015-10-21 DIAGNOSIS — M25511 Pain in right shoulder: Secondary | ICD-10-CM

## 2015-10-21 DIAGNOSIS — Z13228 Encounter for screening for other metabolic disorders: Secondary | ICD-10-CM

## 2015-10-21 DIAGNOSIS — M79621 Pain in right upper arm: Secondary | ICD-10-CM

## 2015-10-21 LAB — COMPLETE METABOLIC PANEL WITH GFR
ALT: 37 U/L (ref 9–46)
AST: 28 U/L (ref 10–35)
Albumin: 4.5 g/dL (ref 3.6–5.1)
Alkaline Phosphatase: 65 U/L (ref 40–115)
BUN: 19 mg/dL (ref 7–25)
CHLORIDE: 102 mmol/L (ref 98–110)
CO2: 25 mmol/L (ref 20–31)
Calcium: 9.6 mg/dL (ref 8.6–10.3)
Creat: 0.96 mg/dL (ref 0.70–1.33)
GFR, Est African American: >89 mL/min (ref >=60)
GFR, Est Non African American: 87 mL/min (ref >=60)
GLUCOSE: 108 mg/dL — AB (ref 65–99)
Potassium: 4.3 mmol/L (ref 3.5–5.3)
SODIUM: 138 mmol/L (ref 135–146)
Total Bilirubin: 1 mg/dL (ref 0.2–1.2)
Total Protein: 7.4 g/dL (ref 6.1–8.1)

## 2015-10-21 LAB — POCT CBC
GRANULOCYTE PERCENT: 67 % (ref 37–80)
HCT, POC: 50.2 % (ref 43.5–53.7)
Hemoglobin: 17.2 g/dL (ref 14.1–18.1)
Lymph, poc: 1.9 (ref 0.6–3.4)
MCH: 30 pg (ref 27–31.2)
MCHC: 34.2 g/dL (ref 31.8–35.4)
MCV: 87.7 fL (ref 80–97)
MID (cbc): 0.6 (ref 0–0.9)
MPV: 7.5 fL (ref 0–99.8)
PLATELET COUNT, POC: 194 10*3/uL (ref 142–424)
POC Granulocyte: 4.9 (ref 2–6.9)
POC LYMPH PERCENT: 25.4 %L (ref 10–50)
POC MID %: 7.6 %M (ref 0–12)
RBC: 5.73 M/uL (ref 4.69–6.13)
RDW, POC: 13.4 %
WBC: 7.3 10*3/uL (ref 4.6–10.2)

## 2015-10-21 MED ORDER — MELOXICAM 15 MG PO TABS: 15.0000 mg | ORAL_TABLET | Freq: Every day | ORAL | Status: DC

## 2015-10-21 NOTE — Patient Instructions (Addendum)
Because you received an x-ray today, you will receive an invoice from [facility]. Please contact [facility] at [phone] with questions or concerns regarding your invoice. Our billing staff will not be able to assist you with those questions.  Please take medication as prescribed.  Do not take ibuprofen or mobic.  You can take tylenol.  If this continues, we will do further testing.   Costochondritis Costochondritis, sometimes called Tietze syndrome, is a swelling and irritation (inflammation) of the tissue (cartilage) that connects your ribs with your breastbone (sternum). It causes pain in the chest and rib area. Costochondritis usually goes away on its own over time. It can take up to 6 weeks or longer to get better, especially if you are unable to limit your activities. CAUSES  Some cases of costochondritis have no known cause. Possible causes include:  Injury (trauma).  Exercise or activity such as lifting.  Severe coughing. SIGNS AND SYMPTOMS  Pain and tenderness in the chest and rib area.  Pain that gets worse when coughing or taking deep breaths.  Pain that gets worse with specific movements. DIAGNOSIS  Your health care provider will do a physical exam and ask about your symptoms. Chest X-rays or other tests may be done to rule out other problems. TREATMENT  Costochondritis usually goes away on its own over time. Your health care provider may prescribe medicine to help relieve pain. HOME CARE INSTRUCTIONS   Avoid exhausting physical activity. Try not to strain your ribs during normal activity. This would include any activities using chest, abdominal, and side muscles, especially if heavy weights are used.  Apply ice to the affected area for the first 2 days after the pain begins.  Put ice in a plastic bag.  Place a towel between your skin and the bag.  Leave the ice on for 20 minutes, 2-3 times a day.  Only take over-the-counter or prescription  medicines as directed by your health care provider. SEEK MEDICAL CARE IF:  You have redness or swelling at the rib joints. These are signs of infection.  Your pain does not go away despite rest or medicine. SEEK IMMEDIATE MEDICAL CARE IF:   Your pain increases or you are very uncomfortable.  You have shortness of breath or difficulty breathing.  You cough up blood.  You have worse chest pains, sweating, or vomiting.  You have a fever or persistent symptoms for more than 2-3 days.  You have a fever and your symptoms suddenly get worse. MAKE SURE YOU:   Understand these instructions.  Will watch your condition.  Will get help right away if you are not doing well or get worse.   This information is not intended to replace advice given to you by your health care provider. Make sure you discuss any questions you have with your health care provider.   Document Released: 06/07/2005 Document Revised: 06/18/2013 Document Reviewed: 04/01/2013 Elsevier Interactive Patient Education [employer]

## 2015-10-21 NOTE — Progress Notes (Signed)
[facility] [address] [place] [address] [phone]  Date:  10/21/2015   Name:  Jason Swanson   DOB:  [birth year]   MRN:  [id number]  PCP:  No primary care provider on file.   Chief Complaint  Patient presents with  . Other    Right side pain    History of Present Illness:  Jason Swanson is a 60 y.o. male patient who presents to [facility] for cc of right sided pain.     Right sided pain for 1 year that has progressively worsened over the last 2 months.  It is along the axillary and extends down along the ribcage.  He can not describe the character of the pain.  It is intermittent and aggravated by bending forward and reaching.  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 No urinary pain, frequency, hematuria.  No weight loss.  No nausea or vomiting.  Bowel movements are normal.  Pain is not aggravated by deep inspiration.     Patient Active Problem List   Diagnosis Date Noted  . Hyperlipidemia 11/18/2011    Past Medical History  Diagnosis Date  . Hyperlipidemia     No past surgical history on file.  Social History  Substance Use Topics  . Smoking status: Never Smoker   . Smokeless tobacco: Never Used  . Alcohol Use: No    Family History  Problem Relation Age of Onset  . Hypertension Father     No Known Allergies  Medication list has been reviewed and updated.  Current Outpatient Prescriptions on File Prior to Visit  Medication Sig Dispense Refill  . cyclobenzaprine (FLEXERIL) 10 MG tablet Take 1 tablet (10 mg total) by mouth 3 (three) times daily as needed for muscle spasms. 30 tablet 0  . naproxen (NAPROSYN) 500 MG tablet Take 1 tablet (500 mg total) by mouth 2 (two) times daily with a meal. 30 tablet 0  . pravastatin (PRAVACHOL) 20 MG tablet TAKE ONE TABLET BY MOUTH ONCE DAILY 30 tablet 0   No current facility-administered medications on file prior to visit.    ROS ROS otherwise  unremarkable unless listed above.   Physical Examination: BP 120/72 mmHg  Pulse 82  Temp(Src) 97.9 F (36.6 C) (Oral)  Resp 19  Wt 186 lb 3.2 oz (84.46 kg)  SpO2 96% Ideal Body Weight:    Physical Exam  Constitutional: He is oriented to person, place, and time. He appears well-developed and well-nourished. No distress.  HENT:  Head: Normocephalic and atraumatic.  Right Ear: Tympanic membrane, external ear and ear canal normal.  Left Ear: Tympanic membrane, external ear and ear canal normal.  Eyes: Conjunctivae and EOM are normal. Pupils are equal, round, and reactive to light.  Cardiovascular: Normal rate and regular rhythm.  Exam reveals no gallop and no friction rub.   No murmur heard. Pulmonary/Chest: Effort normal. No apnea. No respiratory distress. He has no decreased breath sounds. He has no wheezes. He has no rhonchi.  Abdominal: Soft. Bowel sounds are normal. He exhibits no distension and no mass. There is no tenderness.  Musculoskeletal: Normal range of motion. He exhibits no edema or tenderness.  Pain at right axillary side with left lateral deviation.  No swelling or erythema appreciated.      Neurological: He is alert and oriented to person, place, and time. He displays normal reflexes.  Skin: Skin is warm and dry. He is not diaphoretic.  Psychiatric:  He has a normal mood and affect. His behavior is normal.   Results for orders placed or performed in visit on 10/21/15  POCT CBC  Result Value Ref Range   WBC 7.3 4.6 - 10.2 K/uL   Lymph, poc 1.9 0.6 - 3.4   POC LYMPH PERCENT 25.4 10 - 50 %L   MID (cbc) 0.6 0 - 0.9   POC MID % 7.6 0 - 12 %M   POC Granulocyte 4.9 2 - 6.9   Granulocyte percent 67.0 37 - 80 %G   RBC 5.73 4.69 - 6.13 M/uL   Hemoglobin 17.2 14.1 - 18.1 g/dL   HCT, POC 16.1 09.6 - 53.7 %   MCV 87.7 80 - 97 fL   MCH, POC 30.0 27 - 31.2 pg   MCHC 34.2 31.8 - 35.4 g/dL   RDW, POC 04.5 %   Platelet Count, POC 194 142 - 424 K/uL   MPV 7.5 0 - 99.8 fL      Assessment and Plan: Jason Swanson is a 60 y.o. male who is here today for cc of left sided pain.   Possible costochondritis.  He has not allowed for rest with his work, to fully let this inflammation resolve.   I have advised as much rest as possible.  NSAIDs discussed and precautions.  Advised to stop naprosyn.  Ice recommended.  Return if symptoms continue.  Labs performed below.    Pain, axillary, right - Plan: DG Ribs Unilateral W/Chest Right, meloxicam (MOBIC) 15 MG tablet, POCT CBC, COMPLETE METABOLIC PANEL WITH GFR  Screening for metabolic disorder  Trena Platt, PA-C [facility] 2/9/201712:07 PM

## 2015-11-04 ENCOUNTER — Telehealth: Payer: Self-pay | Admitting: Family Medicine

## 2015-11-04 NOTE — Telephone Encounter (Signed)
Please advise patient that his labs were normal.   He is to follow the treatment plan, and let me know if there is no improvement.

## 2015-11-04 NOTE — Telephone Encounter (Signed)
Patient came in office requesting lab results from 10/21/15. Please advise

## 2015-11-05 NOTE — Telephone Encounter (Signed)
Left detailed VM with results.

## 2015-11-28 ENCOUNTER — Other Ambulatory Visit: Payer: Self-pay | Admitting: Physician Assistant

## 2015-12-04 ENCOUNTER — Ambulatory Visit (INDEPENDENT_AMBULATORY_CARE_PROVIDER_SITE_OTHER): Payer: Managed Care, Other (non HMO) | Admitting: Family Medicine

## 2015-12-04 VITALS — BP 118/82 | HR 70 | Temp 98.0°F | Resp 16 | Ht 66.0 in | Wt 188.0 lb

## 2015-12-04 DIAGNOSIS — E785 Hyperlipidemia, unspecified: Secondary | ICD-10-CM | POA: Diagnosis not present

## 2015-12-04 LAB — LIPID PANEL
Cholesterol: 176 mg/dL (ref 125–200)
HDL: 37 mg/dL — ABNORMAL LOW (ref >=40)
LDL Cholesterol: 103 mg/dL (ref <130)
Total CHOL/HDL Ratio: 4.8 Ratio (ref <=5.0)
Triglycerides: 179 mg/dL — ABNORMAL HIGH (ref <150)
VLDL: 36 mg/dL — ABNORMAL HIGH (ref <30)

## 2015-12-04 LAB — COMPLETE METABOLIC PANEL WITH GFR
ALT: 23 U/L (ref 9–46)
AST: 22 U/L (ref 10–35)
Albumin: 4.5 g/dL (ref 3.6–5.1)
Alkaline Phosphatase: 52 U/L (ref 40–115)
BUN: 18 mg/dL (ref 7–25)
CO2: 27 mmol/L (ref 20–31)
Calcium: 9.3 mg/dL (ref 8.6–10.3)
Chloride: 106 mmol/L (ref 98–110)
Creat: 1.11 mg/dL (ref 0.70–1.33)
GFR, Est African American: 84 mL/min (ref >=60)
GFR, Est Non African American: 72 mL/min (ref >=60)
Glucose, Bld: 96 mg/dL (ref 65–99)
Potassium: 4.4 mmol/L (ref 3.5–5.3)
Sodium: 141 mmol/L (ref 135–146)
Total Bilirubin: 1.4 mg/dL — ABNORMAL HIGH (ref 0.2–1.2)
Total Protein: 7.3 g/dL (ref 6.1–8.1)

## 2015-12-04 NOTE — Patient Instructions (Signed)
     IF you received an x-ray today, you will receive an invoice from [facility]. Please contact [facility] at [phone] with questions or concerns regarding your invoice.   IF you received labwork today, you will receive an invoice from [facility]. Please contact [facility] at [phone] with questions or concerns regarding your invoice.   Our billing staff will not be able to assist you with questions regarding bills from these companies.  You will be contacted with the lab results as soon as they are available. The fastest way to get your results is to activate your My Chart account. Instructions are located on the last page of this paperwork. If you have not heard from us regarding the results in 2 weeks, please contact this office.      

## 2015-12-04 NOTE — Progress Notes (Signed)
° °  Subjective:    Patient ID: Jason Swanson, male    DOB: September 04, 1956, 60 y.o.   MRN: [id number] By signing my name below, I, Soijett Blue, attest that this documentation has been prepared under the direction and in the presence of Elvina SidleKurt Lauenstein, MD. Electronically Signed: Soijett Blue, ED Scribe. 12/04/2015. 9:30 AM.  Chief Complaint  Patient presents with   Follow-up    Cholesterol    HPI  HPI Comments: Jason Swanson is a 60 y.o. male with a medical hx of hyperlipidemia who presents today for a follow up on his cholesterol. Pt notes that he has had a high cholesterol in the past. Pt reports that he takes pravastatin with no issues thus far. Pt has been on pravastatin for several years. Pt ran out of his pravastatin 2 weeks ago and is in need of a medication refill. He states that he has not tried any medications for the relief for his symptoms. He denies abdominal pain, fever, chills, and any other symptoms.  Pt works at [employer]. Pt is married with 6 children.   Past Medical History  Diagnosis Date   Hyperlipidemia    No Known Allergies  Current Outpatient Prescriptions on File Prior to Visit  Medication Sig Dispense Refill   cyclobenzaprine (FLEXERIL) 10 MG tablet Take 1 tablet (10 mg total) by mouth 3 (three) times daily as needed for muscle spasms. 30 tablet 0   meloxicam (MOBIC) 15 MG tablet Take 1 tablet (15 mg total) by mouth daily. 30 tablet 1   naproxen (NAPROSYN) 500 MG tablet Take 1 tablet (500 mg total) by mouth 2 (two) times daily with a meal. 30 tablet 0   pravastatin (PRAVACHOL) 20 MG tablet Take 1 tablet (20 mg total) by mouth daily. 30 tablet 0   No current facility-administered medications on file prior to visit.     Review of Systems  Constitutional: Negative for fever and chills.  Gastrointestinal: Negative for abdominal pain.  All other systems reviewed and are negative.      Objective:   Physical Exam  Constitutional: He  is oriented to person, place, and time. He appears well-developed and well-nourished. No distress.  HENT:  Head: Normocephalic and atraumatic.  Eyes: EOM are normal.  Neck: Neck supple.  Cardiovascular: Normal rate, regular rhythm and normal heart sounds.  Exam reveals no gallop and no friction rub.   No murmur heard. Pulmonary/Chest: Effort normal and breath sounds normal. No respiratory distress. He has no wheezes. He has no rales.  Abdominal: Soft. There is no tenderness.  No HSM  Musculoskeletal: Normal range of motion.  Neurological: He is alert and oriented to person, place, and time.  Skin: Skin is warm and dry.  Psychiatric: He has a normal mood and affect. His behavior is normal.  Nursing note and vitals reviewed.       BP 118/82 mmHg   Pulse 70   Temp(Src) 98 F (36.7 C) (Oral)   Resp 16   Ht 5\' 6"  (1.676 m)   Wt 188 lb (85.276 kg)   BMI 30.36 kg/m2   SpO2 98%  Assessment & Plan:   This chart was scribed in my presence and reviewed by me personally.    ICD-9-CM ICD-10-CM   1. Hyperlipidemia 272.4 E78.5 COMPLETE METABOLIC PANEL WITH GFR     Lipid panel     Signed, Elvina SidleKurt Lauenstein, MD

## 2016-03-18 ENCOUNTER — Ambulatory Visit (INDEPENDENT_AMBULATORY_CARE_PROVIDER_SITE_OTHER): Payer: Self-pay | Admitting: Osteopathic Medicine

## 2016-03-18 VITALS — BP 122/78 | HR 68 | Temp 97.8°F | Resp 16 | Ht 66.0 in | Wt 189.4 lb

## 2016-03-18 DIAGNOSIS — Z0000 Encounter for general adult medical examination without abnormal findings: Secondary | ICD-10-CM

## 2016-03-18 DIAGNOSIS — Z0289 Encounter for other administrative examinations: Secondary | ICD-10-CM

## 2016-03-18 NOTE — Patient Instructions (Addendum)
  Based on review of your records and on today's physical exam, I see no reason that he should not be able to obtain a driver's license. I have completed the paperwork for you, but he still need to follow-up with your eye doctor for a formal vision exam to see if you need an updated prescription for glasses or contact lenses. The Department of Motor Vehicles may also contact you about setting up a test prior to getting her license to make sure that you are familiar with rules and regulations of driving in [place].   Please let us know if there is anything else we can do for you!   Take care! -Dr. Lyn HollingsheadAlexander   IF you received an x-ray today, you will receive an invoice from [facility]. Please contact [facility] at [phone] with questions or concerns regarding your invoice.   IF you received labwork today, you will receive an invoice from [facility]. Please contact [facility] at [phone] with questions or concerns regarding your invoice.   Our billing staff will not be able to assist you with questions regarding bills from these companies.  You will be contacted with the lab results as soon as they are available. The fastest way to get your results is to activate your My Chart account. Instructions are located on the last page of this paperwork. If you have not heard from us regarding the results in 2 weeks, please contact this office.

## 2016-03-18 NOTE — Progress Notes (Signed)
HPI: Jason Swanson is a 60 y.o. male who presents to [facility] 03/18/2016 for chief complaint of:  Chief Complaint  Patient presents with  . DMV Paperwork    Patient is here for completion of [place] DOT[place]DMV form for medical clearance. See scanned documents for full details. Patient has no complaints today.  Past medical, social and family history reviewed: Past Medical History  Diagnosis Date  . Hyperlipidemia    No past surgical history on file. Social History  Substance Use Topics  . Smoking status: Never Smoker   . Smokeless tobacco: Never Used  . Alcohol Use: No   Family History  Problem Relation Age of Onset  . Hypertension Father     Current Outpatient Prescriptions  Medication Sig Dispense Refill  . pravastatin (PRAVACHOL) 20 MG tablet Take 1 tablet (20 mg total) by mouth daily. 30 tablet 0   No current facility-administered medications for this visit.   No Known Allergies    Review of Systems: CONSTITUTIONAL:  No  fever, no chills, No  unintentional weight changes HEAD/EYES/EARS/NOSE/THROAT: No  headache, no vision change CARDIAC: No  chest pain, No  pressure, No palpitations RESPIRATORY: No  cough, No  shortness of breath/wheeze, does not use CPAP machine or have history of sleep apnea GASTROINTESTINAL: No  nausea, No  vomiting, No  abdominal pain MUSCULOSKELETAL: No  myalgia/arthralgia SKIN: No  rash/wounds/concerning lesions NEUROLOGIC: No  weakness, No  dizziness, No  slurred speech, no history of seizures PSYCHIATRIC: No  concerns with depression, No  concerns with anxiety, No sleep problems  Exam:  BP 122/78 mmHg  Pulse 68  Temp(Src) 97.8 F (36.6 C) (Oral)  Resp 16  Ht 5\' 6"  (1.676 m)  Wt 189 lb 6.4 oz (85.911 kg)  BMI 30.58 kg/m2  SpO2 98% Constitutional: VS see above. General Appearance: alert, well-developed, well-nourished, NAD Eyes: Normal lids and conjunctive, non-icteric sclera, PERRLA, EOMI Ears,  Nose, Mouth, Throat: MMM, Normal external inspection ears/nares/mouth/lips/gums, TM normal bilaterally. Pharynx no erythema, no exudate.  Neck: No masses, trachea midline. No thyroid enlargement/tenderness/mass appreciated. No lymphadenopathy Respiratory: Normal respiratory effort. no wheeze, no rhonchi, no rales Cardiovascular: S1/S2 normal, no murmur, no rub/gallop auscultated. RRR. No lower extremity edema. Gastrointestinal: Nontender, no masses. No hepatomegaly, no splenomegaly. No hernia appreciated. Bowel sounds normal. Rectal exam deferred.  Musculoskeletal: Gait normal. No clubbing/cyanosis of digits.  Neurological: No cranial nerve deficit on limited exam. Motor and sensation intact and symmetric, normal cerebellar reflexes Skin: warm, dry, intact. No rash/ulcer. No concerning nevi or subq nodules on limited exam.   Psychiatric: Normal judgment/insight. Normal mood and affect. Oriented x3.   Previous lab results reviewed: Metabolic panel in March 2017 shows normal glucose, cholesterol mildly elevated.  ASSESSMENT/PLAN:  Physical exam, routine - From medical standpoint, no concerns for driving ability. I did recommend a road test and that patient follow-up with ophthalmologist/optometrist since it has been more than 2 years since his last formal vision exam and the patient does wear glasses.   Visit summary printed and instructions reviewed with the patient. ER/RTC precautions were reviewed. All questions answered. Return as needed and for annual physical exam / wellness exam / routine care.

## 2016-06-10 ENCOUNTER — Ambulatory Visit (INDEPENDENT_AMBULATORY_CARE_PROVIDER_SITE_OTHER): Payer: Managed Care, Other (non HMO) | Admitting: Physician Assistant

## 2016-06-10 VITALS — BP 124/82 | HR 72 | Temp 97.8°F | Resp 17 | Ht 66.0 in | Wt 190.0 lb

## 2016-06-10 DIAGNOSIS — E781 Pure hyperglyceridemia: Secondary | ICD-10-CM

## 2016-06-10 DIAGNOSIS — E785 Hyperlipidemia, unspecified: Secondary | ICD-10-CM

## 2016-06-10 MED ORDER — PRAVASTATIN SODIUM 20 MG PO TABS: 20.0000 mg | ORAL_TABLET | Freq: Every day | ORAL | 5 refills | Status: DC

## 2016-06-10 NOTE — Patient Instructions (Addendum)
  Please return next Saturday for your blood draw.  I will report back the results.  I am refilling your current prescription of the pravastatin for 6 months.     IF you received an x-ray today, you will receive an invoice from [facility]. Please contact [facility] at [phone] with questions or concerns regarding your invoice.   IF you received labwork today, you will receive an invoice from [facility]. Please contact [facility] at [phone] with questions or concerns regarding your invoice.   Our billing staff will not be able to assist you with questions regarding bills from these companies.  You will be contacted with the lab results as soon as they are available. The fastest way to get your results is to activate your My Chart account. Instructions are located on the last page of this paperwork. If you have not heard from us regarding the results in 2 weeks, please contact this office.

## 2016-06-10 NOTE — Progress Notes (Signed)
[facility] [address], [place] [address] [phone]  By signing my name below I, Jason Swanson, attest that this documentation has been prepared under the direction and in the presence of Trena PlattStephanie English PA. Electonically Signed. Jason Swanson, Scribe 06/10/2016 at 12:08 PM  Date:  06/10/2016   Name:  Jason PandaGirma W Swanson   DOB:  [birth year]   MRN:  [id number]  PCP:  Dolores Lorylark,Michael Lee, PA-C    History of Present Illness: Chief Complaint  Patient presents with   Medication Refill    pravastatin    Roetta SessionsGirma W Rommel is a 60 y.o. male patient who [facility] for a medication refill of pravastatin. Pt is not fasting but will return in 1 weeks for lab draw. Pt's diet consist of a lot of bread. He does not eat fried foods. Pt drinks 1 soda a day. No sweet teas.. Pt denies CP, palpitation, SOB, leg swelling.   Pt is from [place].    Patient Active Problem List   Diagnosis Date Noted   Hyperlipidemia 11/18/2011    Past Medical History:  Diagnosis Date   Hyperlipidemia     No past surgical history on file.  Social History  Substance Use Topics   Smoking status: Never Smoker   Smokeless tobacco: Never Used   Alcohol use No    Family History  Problem Relation Age of Onset   Hypertension Father     No Known Allergies  Medication list has been reviewed and updated.  Current Outpatient Prescriptions on File Prior to Visit  Medication Sig Dispense Refill   pravastatin (PRAVACHOL) 20 MG tablet Take 1 tablet (20 mg total) by mouth daily. 30 tablet 0   No current facility-administered medications on file prior to visit.     ROS ROS unremarkable unless otherwise specified.  Physical Examination: BP 124/82 (BP Location: Left Arm, Patient Position: Sitting, Cuff Size: Normal)    Pulse 72    Temp 97.8 F (36.6 C) (Oral)    Resp 17    Ht 5\' 6"  (1.676 m)    Wt 190 lb (86.2 kg)    SpO2 99%    BMI 30.67 kg/m  Ideal Body Weight:  @FLOWAMB ([id number])@([phone])@  Physical Exam  Constitutional: He is oriented to person, place, and time. He appears well-developed and well-nourished. No distress.  HENT:  Head: Normocephalic and atraumatic.  Right Ear: External ear normal.  Left Ear: External ear normal.  Eyes: Conjunctivae and EOM are normal. Pupils are equal, round, and reactive to light.  Cardiovascular: Normal rate, regular rhythm, normal heart sounds and intact distal pulses.  Exam reveals no gallop and no friction rub.   No murmur heard. Pulmonary/Chest: Effort normal and breath sounds normal. No respiratory distress. He has no wheezes. He has no rales. He exhibits no tenderness.  Neurological: He is alert and oriented to person, place, and time.  Skin: Skin is warm and dry. He is not diaphoretic.  Psychiatric: He has a normal mood and affect. His behavior is normal.    Assessment and Plan: Roetta SessionsGirma W Flaim is a 60 y.o. male who is here today for cholesterol recheck. I will go ahead and refill his medicaiton at this time.  Advised to return within the next 2 weeks for labs only of the lipid panel while fasting.  He voiced understanding. Hyperlipidemia - Plan: pravastatin (PRAVACHOL) 20 MG tablet, Lipid panel, CANCELED: Lipid panel  Trena PlattStephanie English, PA-C [facility] 10/1/20171:20 PM I personally performed the services described in this documentation, which was scribed in my presence. The recorded information has been reviewed and is accurate.

## 2016-06-17 ENCOUNTER — Ambulatory Visit (INDEPENDENT_AMBULATORY_CARE_PROVIDER_SITE_OTHER): Payer: Managed Care, Other (non HMO) | Admitting: Family Medicine

## 2016-06-17 VITALS — BP 112/80 | HR 79 | Temp 98.1°F | Resp 16 | Ht 65.5 in | Wt 186.4 lb

## 2016-06-17 DIAGNOSIS — E781 Pure hyperglyceridemia: Secondary | ICD-10-CM

## 2016-06-17 LAB — LIPID PANEL
Cholesterol: 192 mg/dL (ref 125–200)
HDL: 38 mg/dL — AB (ref >=40)
LDL CALC: 118 mg/dL (ref <130)
TRIGLYCERIDES: 181 mg/dL — AB (ref <150)
Total CHOL/HDL Ratio: 5.1 Ratio — ABNORMAL HIGH (ref <=5.0)
VLDL: 36 mg/dL — AB (ref <30)

## 2016-06-17 NOTE — Patient Instructions (Signed)
     IF you received an x-ray today, you will receive an invoice from [facility]. Please contact [facility] at [phone] with questions or concerns regarding your invoice.   IF you received labwork today, you will receive an invoice from [facility]. Please contact [facility] at [phone] with questions or concerns regarding your invoice.   Our billing staff will not be able to assist you with questions regarding bills from these companies.  You will be contacted with the lab results as soon as they are available. The fastest way to get your results is to activate your My Chart account. Instructions are located on the last page of this paperwork. If you have not heard from us regarding the results in 2 weeks, please contact this office.      

## 2016-06-17 NOTE — Progress Notes (Signed)
Here for lab visit

## 2016-06-24 ENCOUNTER — Telehealth: Payer: Self-pay | Admitting: Radiology

## 2016-06-24 NOTE — Telephone Encounter (Signed)
Pt requesting lab results. Please review. Thank you.

## 2016-06-26 NOTE — Telephone Encounter (Signed)
Please let the patient know that his total cholesterol is good. He should continue the pravastatin. His triglycerides, the free floating fats, are 181. This is almost the same as last check 6 months ago.  Please advise the patient to add fish oil supplement over the counter to help improve his triglycerides.  A fish oil supplement will also increase his HDL which is 38.  We like this to higher than 50 to offer protective benefit.

## 2016-06-30 ENCOUNTER — Telehealth: Payer: Self-pay | Admitting: Emergency Medicine

## 2016-06-30 NOTE — Telephone Encounter (Signed)
Left message to return call 

## 2016-07-03 NOTE — Telephone Encounter (Signed)
Attempted to call pt, left VM for pt to call back  

## 2016-07-03 NOTE — Telephone Encounter (Signed)
LMTRC jp/cma  

## 2016-07-06 ENCOUNTER — Encounter: Payer: Self-pay | Admitting: *Deleted

## 2016-07-06 NOTE — Telephone Encounter (Signed)
Unable to reach letter sent

## 2016-11-02 ENCOUNTER — Ambulatory Visit (INDEPENDENT_AMBULATORY_CARE_PROVIDER_SITE_OTHER): Payer: BLUE CROSS/BLUE SHIELD | Admitting: Physician Assistant

## 2016-11-02 VITALS — BP 120/72 | HR 87 | Temp 98.4°F | Ht 65.25 in | Wt 182.0 lb

## 2016-11-02 DIAGNOSIS — Z130 Encounter for screening for diseases of the blood and blood-forming organs and certain disorders involving the immune mechanism: Secondary | ICD-10-CM | POA: Diagnosis not present

## 2016-11-02 DIAGNOSIS — R7989 Other specified abnormal findings of blood chemistry: Secondary | ICD-10-CM

## 2016-11-02 DIAGNOSIS — Z13228 Encounter for screening for other metabolic disorders: Secondary | ICD-10-CM | POA: Diagnosis not present

## 2016-11-02 DIAGNOSIS — Z9189 Other specified personal risk factors, not elsewhere classified: Secondary | ICD-10-CM

## 2016-11-02 DIAGNOSIS — G44209 Tension-type headache, unspecified, not intractable: Secondary | ICD-10-CM | POA: Diagnosis not present

## 2016-11-02 DIAGNOSIS — R946 Abnormal results of thyroid function studies: Secondary | ICD-10-CM

## 2016-11-02 DIAGNOSIS — Z114 Encounter for screening for human immunodeficiency virus [HIV]: Secondary | ICD-10-CM | POA: Diagnosis not present

## 2016-11-02 DIAGNOSIS — Z1329 Encounter for screening for other suspected endocrine disorder: Secondary | ICD-10-CM | POA: Diagnosis not present

## 2016-11-02 MED ORDER — ACETAMINOPHEN 500 MG PO TABS: ORAL_TABLET | ORAL | 99 refills | Status: DC

## 2016-11-02 MED ORDER — CYCLOBENZAPRINE HCL 10 MG PO TABS: ORAL_TABLET | ORAL | 0 refills | Status: DC

## 2016-11-02 MED ORDER — ATORVASTATIN CALCIUM 10 MG PO TABS: 10.0000 mg | ORAL_TABLET | Freq: Every day | ORAL | 3 refills | Status: DC

## 2016-11-02 NOTE — Progress Notes (Signed)
11/02/2016 9:08 AM   DOB: 02/28/1956 / MRN: [id number]  SUBJECTIVE:  Jason Swanson is a 61 y.o. male presenting for cholesterol recheck. Last ASCVD risk at 8.4% using last lipids, on statin, and today's BP.  Never smoker. No HTN history and not taking any of those treatments.   Complains of a HA that has been present for a year however they are occurring more frequently. They are hatband in presentation and always with neck pain.  No nausea, photophobia, weakness, numbness, confusion, changes in vision.    He has No Known Allergies.   He  has a past medical history of Hyperlipidemia.    He  reports that he has never smoked. He has never used smokeless tobacco. He reports that he does not drink alcohol or use drugs. He  reports that he currently engages in sexual activity and has had male partners. The patient  has no past surgical history on file.  His family history includes Hypertension in his father.  Review of Systems  Constitutional: Negative for weight loss.  Gastrointestinal: Negative for nausea.  Musculoskeletal: Positive for neck pain.  Neurological: Negative for dizziness, focal weakness and loss of consciousness.    The problem list and medications were reviewed and updated by myself where necessary and exist elsewhere in the encounter.   OBJECTIVE:  BP 120/72 (BP Location: Right Arm, Patient Position: Sitting, Cuff Size: Large)   Pulse 87   Temp 98.4 F (36.9 C) (Oral)   Ht 5' 5.25" (1.657 m)   Wt 182 lb (82.6 kg)   SpO2 95%   BMI 30.05 kg/m   Physical Exam  Constitutional: He is oriented to person, place, and time.  Cardiovascular: Normal rate, regular rhythm and normal heart sounds.  Exam reveals no gallop, no friction rub and no decreased pulses.   No murmur heard. Pulmonary/Chest: Effort normal and breath sounds normal. No respiratory distress. He has no decreased breath sounds. He has no wheezes. He has no rhonchi. He has no rales.  Musculoskeletal: He  exhibits no edema.  Neurological: He is alert and oriented to person, place, and time. He has normal reflexes. He displays no atrophy, no tremor and normal reflexes. No cranial nerve deficit or sensory deficit. He exhibits normal muscle tone. He displays no seizure activity. Coordination and gait normal. GCS eye subscore is 4. GCS verbal subscore is 5. GCS motor subscore is 6.  Heel and toe walking intact to challenge.   Skin: Skin is warm and dry. He is not diaphoretic.      No results found for this or any previous visit (from the past 72 hour(s)).  No results found.  ASSESSMENT AND PLAN:  Verne GrainGirma was seen today for follow-up, headache and medication refill.  Diagnoses and all orders for this visit:  Candidate for statin therapy due to risk of future cardiovascular event -     atorvastatin (LIPITOR) 10 MG tablet; Take 1 tablet (10 mg total) by mouth daily.  Screening for endocrine, metabolic and immunity disorder -     TSH -     Hemoglobin A1c -     CBC -     Basic metabolic panel  Screening for HIV (human immunodeficiency virus) -     HIV antibody  Tension headache: No red flags. Advised he walk twenty minutes daily and take 2 tabs of 500 mg tylenol as needed for a HA. If this fails to remedy the HA okay to add a half tab of flexeril at  bedtime. RTC in one month if not improved.   -     acetaminophen (TYLENOL) 500 MG tablet; Take 2 tabs every 8 hours as needed for neck pain and HA. -     cyclobenzaprine (FLEXERIL) 10 MG tablet; Take 1/2 tab at night only for HA.    The patient is advised to call or return to clinic if he does not see an improvement in symptoms, or to seek the care of the closest emergency department if he worsens with the above plan.   Deliah Boston, MHS, PA-C [facility] 11/02/2016 9:08 AM

## 2016-11-02 NOTE — Patient Instructions (Signed)
Walk twenty minutes daily and take 2 tabs of 500 mg tylenol as needed for a HA. If this fails to remedy the HA okay to add a half tab of flexeril at bedtime.

## 2016-11-03 LAB — BASIC METABOLIC PANEL
BUN / CREAT RATIO: 19 (ref 9–20)
BUN: 15 mg/dL (ref 6–24)
CO2: 24 mmol/L (ref 18–29)
CREATININE: 0.79 mg/dL (ref 0.76–1.27)
Calcium: 9.7 mg/dL (ref 8.7–10.2)
Chloride: 105 mmol/L (ref 96–106)
GFR calc Af Amer: 114 (ref >59)
GFR calc non Af Amer: 98 (ref >59)
GLUCOSE: 103 mg/dL — AB (ref 65–99)
POTASSIUM: 4.3 mmol/L (ref 3.5–5.2)
SODIUM: 141 mmol/L (ref 134–144)

## 2016-11-03 LAB — HEMOGLOBIN A1C
ESTIMATED AVERAGE GLUCOSE: 111
HEMOGLOBIN A1C: 5.5 % (ref 4.8–5.6)

## 2016-11-03 LAB — CBC
HEMATOCRIT: 47.1 % (ref 37.5–51.0)
Hemoglobin: 15.6 g/dL (ref 13.0–17.7)
MCH: 28.5 pg (ref 26.6–33.0)
MCHC: 33.1 g/dL (ref 31.5–35.7)
MCV: 86 fL (ref 79–97)
Platelets: 198 10*3/uL (ref 150–379)
RBC: 5.47 x10E6/uL (ref 4.14–5.80)
RDW: 12.9 % (ref 12.3–15.4)
WBC: 6.7 10*3/uL (ref 3.4–10.8)

## 2016-11-03 LAB — TSH: TSH: <0.006 u[IU]/mL — ABNORMAL LOW (ref 0.450–4.500)

## 2016-11-03 LAB — HIV ANTIBODY (ROUTINE TESTING W REFLEX): HIV SCREEN 4TH GENERATION: NONREACTIVE

## 2016-11-03 NOTE — Addendum Note (Signed)
Addended by: Isaac BlissGALLOWAY, Sydell Prowell J on: 11/03/2016 09:00 AM   Modules accepted: Orders

## 2016-11-04 LAB — T4, FREE: FREE T4: 3.09 ng/dL — AB (ref 0.82–1.77)

## 2016-11-06 ENCOUNTER — Telehealth: Payer: Self-pay | Admitting: Physician Assistant

## 2016-11-06 DIAGNOSIS — E0590 Thyrotoxicosis, unspecified without thyrotoxic crisis or storm: Secondary | ICD-10-CM | POA: Insufficient documentation

## 2016-11-06 NOTE — Telephone Encounter (Signed)
LM on patient machine advising I am sending him to endocrinology for treatment of hyperthyroidism. Will go ahead and get a scan of the thyroid hormone. Deliah BostonMichael Andilyn Bettcher, MS, PA-C 6:36 PM, 11/06/2016

## 2016-11-22 ENCOUNTER — Telehealth: Payer: Self-pay

## 2016-11-22 DIAGNOSIS — E7849 Other hyperlipidemia: Secondary | ICD-10-CM

## 2016-11-22 NOTE — Telephone Encounter (Signed)
Per shay, clark wants pt to come in for fasting lab for lipid. L/m for pt. Re this. Please place lab orders

## 2016-11-24 ENCOUNTER — Encounter: Payer: Self-pay | Admitting: Endocrinology

## 2016-12-02 ENCOUNTER — Other Ambulatory Visit: Payer: BLUE CROSS/BLUE SHIELD

## 2016-12-02 DIAGNOSIS — E0590 Thyrotoxicosis, unspecified without thyrotoxic crisis or storm: Secondary | ICD-10-CM

## 2016-12-02 NOTE — Telephone Encounter (Signed)
Pt was fasting this AM

## 2016-12-02 NOTE — Telephone Encounter (Signed)
Pt walked in today for labs. Checked with chelle and ov and lab history, advised to check lipid and cmp due to cholesterol med use and elevated glucose.

## 2016-12-03 LAB — LIPID PANEL
CHOLESTEROL TOTAL: 119 mg/dL (ref 100–199)
Chol/HDL Ratio: 3.5 ratio units (ref 0.0–5.0)
HDL: 34 mg/dL — ABNORMAL LOW (ref >39)
LDL Calculated: 64 mg/dL (ref 0–99)
Triglycerides: 106 mg/dL (ref 0–149)
VLDL CHOLESTEROL CAL: 21 mg/dL (ref 5–40)

## 2016-12-03 LAB — COMPREHENSIVE METABOLIC PANEL
ALBUMIN: 4.2 g/dL (ref 3.5–5.5)
ALK PHOS: 90 IU/L (ref 39–117)
ALT: 30 IU/L (ref 0–44)
AST: 19 IU/L (ref 0–40)
Albumin/Globulin Ratio: 1.6 (ref 1.2–2.2)
BILIRUBIN TOTAL: 0.9 mg/dL (ref 0.0–1.2)
BUN / CREAT RATIO: 21 — AB (ref 9–20)
BUN: 19 mg/dL (ref 6–24)
CHLORIDE: 105 mmol/L (ref 96–106)
CO2: 24 mmol/L (ref 18–29)
Calcium: 9.3 mg/dL (ref 8.7–10.2)
Creatinine, Ser: 0.89 mg/dL (ref 0.76–1.27)
GFR calc Af Amer: 108 mL/min/{1.73_m2} (ref >59)
GFR calc non Af Amer: 94 mL/min/{1.73_m2} (ref >59)
GLUCOSE: 113 mg/dL — AB (ref 65–99)
Globulin, Total: 2.7 g/dL (ref 1.5–4.5)
Potassium: 4.5 mmol/L (ref 3.5–5.2)
Sodium: 142 mmol/L (ref 134–144)
Total Protein: 6.9 g/dL (ref 6.0–8.5)

## 2016-12-07 ENCOUNTER — Encounter: Payer: Self-pay | Admitting: *Deleted

## 2016-12-20 ENCOUNTER — Other Ambulatory Visit: Payer: Self-pay | Admitting: Physician Assistant

## 2016-12-20 ENCOUNTER — Ambulatory Visit (INDEPENDENT_AMBULATORY_CARE_PROVIDER_SITE_OTHER): Payer: BLUE CROSS/BLUE SHIELD | Admitting: Endocrinology

## 2016-12-20 ENCOUNTER — Encounter: Payer: Self-pay | Admitting: Endocrinology

## 2016-12-20 ENCOUNTER — Telehealth: Payer: Self-pay | Admitting: Endocrinology

## 2016-12-20 VITALS — BP 126/72 | HR 81 | Ht 62.5 in | Wt 183.0 lb

## 2016-12-20 DIAGNOSIS — E0590 Thyrotoxicosis, unspecified without thyrotoxic crisis or storm: Secondary | ICD-10-CM

## 2016-12-20 DIAGNOSIS — G44209 Tension-type headache, unspecified, not intractable: Secondary | ICD-10-CM

## 2016-12-20 MED ORDER — METHIMAZOLE 10 MG PO TABS: 20.0000 mg | ORAL_TABLET | Freq: Two times a day (BID) | ORAL | 2 refills | Status: DC

## 2016-12-20 NOTE — Telephone Encounter (Signed)
I contacted the patient's wife and advised this refill would need to come from his PCP.

## 2016-12-20 NOTE — Patient Instructions (Addendum)
I have sent a prescription to your pharmacy, to slow the thyroid. If ever you have fever while taking methimazole, stop it and call us, even if the reason is obvious, because of the risk of a rare side-effect. This may help your muscle pain, too. Please come back for a follow-up appointment in 1 month. If you want to choose one of the other treatment options we discussed, we can do that when your thyroid is better.

## 2016-12-20 NOTE — Telephone Encounter (Signed)
Patient stated pharmacy haven't received cyclobenzaprine (FLEXERIL) 10 MG tablet  [facility] [address], [place] - [address] [phone] (Phone) [phone] (Fax)

## 2016-12-20 NOTE — Progress Notes (Signed)
Subjective:    Patient ID: Jason Swanson, male    DOB: 1956-05-01, 61 y.o.   MRN: [id number]  HPI Pt is referred by Deliah Boston, PA, for hyperthyroidism.  Pt reports he was dx'ed with hyperthyroidism in 2018.  He has never been on therapy for this.  He has never had XRT to the anterior neck, or thyroid surgery.  He has never had thyroid imaging.  He does not consume kelp or any other prescribed or non-prescribed thyroid medication.  He has never been on amiodarone.  He reports slight pain at the back of the neck, and assoc headache. Past Medical History:  Diagnosis Date  . Hyperlipidemia     No past surgical history on file.  Social History   Social History  . Marital status: Married    Spouse name: [name]  . Number of children: 6  . Years of education: 53   Occupational History  . 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 Smoking status: Never Smoker  . Smokeless tobacco: Never Used  . Alcohol use No  . Drug use: No  . Sexual activity: Yes    Partners: Female   Other Topics Concern  . Not on file   Social History Narrative   Lives with his wife and two younger sons.  Older children live in [place].  He is from [place], and came to the [place] in 2010.    Current Outpatient Prescriptions on File Prior to Visit  Medication Sig Dispense Refill  . acetaminophen (TYLENOL) 500 MG tablet Take 2 tabs every 8 hours as needed for neck pain and HA. 90 tablet prn  . atorvastatin (LIPITOR) 10 MG tablet Take 1 tablet (10 mg total) by mouth daily. 90 tablet 3   No current facility-administered medications on file prior to visit.     No Known Allergies  Family History  Problem Relation Age of Onset  . Hypertension Father   . Thyroid disease Neg Hx     BP 126/72   Pulse 81   Ht 5' 2.5" (1.588 m)   Wt 183 lb (83 kg)   SpO2 98%   BMI 32.94 kg/m    Review of Systems denies seizure, hoarseness, diplopia, palpitations, sob, diarrhea, polyuria,  muscle weakness, tremor, heat intolerance, easy bruising, and rhinorrhea. He has lost a few lbs.  He has excessive diaphoresis and anxiety.    Objective:   Physical Exam VS: see vs page GEN: no distress HEAD: head: no deformity.  eyes: no periorbital swelling, no proptosis.  external nose and ears are normal mouth: no lesion seen NECK: supple, thyroid is not enlarged CHEST WALL: no deformity. LUNGS: clear to auscultation CV: reg rate and rhythm, no murmur.  ABD: abdomen is soft, nontender.  no hepatosplenomegaly.  not distended.  no hernia MUSCULOSKELETAL: muscle bulk and strength are grossly normal.  no obvious joint swelling.  gait is normal and steady EXTEMITIES: no deformity.  no edema PULSES: no carotid bruit NEURO:  cn 2-12 grossly intact.   readily moves all 4's.  sensation is intact to touch on all 4's.  No tremor SKIN:  Normal texture and temperature.  No rash or suspicious lesion is visible.   NODES:  None palpable at the neck PSYCH: alert, well-oriented.  Does not appear anxious nor depressed.    Lab Results  Component Value Date   TSH <0.006 (L) 11/02/2016   I have reviewed outside records, and summarized: Several chronic med probs  were addressed, and in the eval of these, abnormal TSH was found.     Assessment & Plan:  Hyperthyroidism, new, due to Grave's Dz.   We discussed rx options.  He chooses tapazole, at least for now.  Myalgias: possibly due to hyperthyroidism.   Patient Instructions  I have sent a prescription to your pharmacy, to slow the thyroid. If ever you have fever while taking methimazole, stop it and call us, even if the reason is obvious, because of the risk of a rare side-effect. This may help your muscle pain, too. Please come back for a follow-up appointment in 1 month. If you want to choose one of the other treatment options we discussed, we can do that when your thyroid is better.

## 2016-12-20 NOTE — Telephone Encounter (Signed)
11/02/16 last refill

## 2017-01-19 ENCOUNTER — Encounter: Payer: Self-pay | Admitting: Endocrinology

## 2017-01-19 ENCOUNTER — Ambulatory Visit (INDEPENDENT_AMBULATORY_CARE_PROVIDER_SITE_OTHER): Payer: BLUE CROSS/BLUE SHIELD | Admitting: Endocrinology

## 2017-01-19 VITALS — BP 128/72 | HR 80 | Ht 62.5 in | Wt 187.6 lb

## 2017-01-19 DIAGNOSIS — E0590 Thyrotoxicosis, unspecified without thyrotoxic crisis or storm: Secondary | ICD-10-CM

## 2017-01-19 LAB — TSH: TSH: <0.01 u[IU]/mL — ABNORMAL LOW (ref 0.35–4.50)

## 2017-01-19 LAB — T4, FREE: Free T4: 0.68 ng/dL (ref 0.60–1.60)

## 2017-01-19 NOTE — Progress Notes (Signed)
Subjective:    Patient ID: Jason Swanson, male    DOB: [birth year], 61 y.o.   MRN: [id number]  HPI Pt returns for f/u of hyperthyroidism (dx'ed 2018; he chooses tapazole, at least for now; he has never had thyroid imaging).  Since on tapazole, pt states he feels better in general.  In particular, anxiety and myalgias are less now.   Past Medical History:  Diagnosis Date  . Hyperlipidemia     No past surgical history on file.  Social History   Social History  . Marital status: Married    Spouse name: [name]  . Number of children: 6  . 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 Smoking status: Never Smoker  . Smokeless tobacco: Never Used  . Alcohol use No  . Drug use: No  . Sexual activity: Yes    Partners: Female   Other Topics Concern  . Not on file   Social History Narrative   Lives with his wife and two younger sons.  Older children live in [place].  He is from [place], and came to the [place] in 2010.    Current Outpatient Prescriptions on File Prior to Visit  Medication Sig Dispense Refill  . atorvastatin (LIPITOR) 10 MG tablet Take 1 tablet (10 mg total) by mouth daily. 90 tablet 3  . acetaminophen (TYLENOL) 500 MG tablet Take 2 tabs every 8 hours as needed for neck pain and HA. (Patient not taking: Reported on 01/19/2017) 90 tablet prn  . cyclobenzaprine (FLEXERIL) 10 MG tablet TAKE 1/2 TABLET BY MOUTH AT NIGHT ONLY FOR HEADACHE (Patient not taking: Reported on 01/19/2017) 30 tablet 0   No current facility-administered medications on file prior to visit.     No Known Allergies  Family History  Problem Relation Age of Onset  . Hypertension Father   . Thyroid disease Neg Hx     BP 128/72 (BP Location: Right Arm, Patient Position: Sitting, Cuff Size: Normal)   Pulse 80   Ht 5' 2.5" (1.588 m)   Wt 187 lb 9.6 oz (85.1 kg)   SpO2 97%   BMI 33.77 kg/m    Review of Systems Denies  fever    Objective:   Physical Exam VITAL SIGNS:  See vs page GENERAL: no distress NECK: There is no palpable thyroid enlargement.  No thyroid nodule is palpable.  No palpable lymphadenopathy at the anterior neck.   Lab Results  Component Value Date   TSH <0.01 Repeated and verified X2. (L) 01/19/2017      Assessment & Plan:  Hyperthyroidism: improved: I have sent a prescription to your pharmacy, to reduce tapazole.   Patient Instructions  blood tests are requested for you today.  We'll let you know about the results. If ever you have fever while taking methimazole, stop it and call us, even if the reason is obvious, because of the risk of a rare side-effect. If you want to, you can take the radioactive iodine pill.  It works like this: We would first check a thyroid "scan" (a special, but easy and painless type of thyroid x ray). you go to the x-ray department of the hospital to swallow a pill, which contains a miniscule amount of radiation.  You will not notice any symptoms from this.  You will go back to the x-ray department the next day, to lie down in front of a camera.  The results  of this will be sent to me.   Based on the results, i hope to order for you a treatment pill of radioactive iodine.  Although it is a larger amount of radiation, you will again notice no symptoms from this.  The pill is gone from your body in a few days (during which you should stay away from other people), but takes several months to work.  Therefore, please return here approximately 6-8 weeks after the treatment.  This treatment has been available for many years, and the only known side-effect is an underactive thyroid.  It is possible that i would eventually prescribe for you a thyroid hormone pill, which is very inexpensive.  You don't have to worry about side-effects of this thyroid hormone pill, because it is the same molecule your thyroid makes. Please come back for a follow-up appointment in 2 months.

## 2017-01-19 NOTE — Patient Instructions (Addendum)
blood tests are requested for you today.  We'll let you know about the results. If ever you have fever while taking methimazole, stop it and call us, even if the reason is obvious, because of the risk of a rare side-effect. If you want to, you can take the radioactive iodine pill.  It works like this: We would first check a thyroid "scan" (a special, but easy and painless type of thyroid x ray). you go to the x-ray department of the hospital to swallow a pill, which contains a miniscule amount of radiation.  You will not notice any symptoms from this.  You will go back to the x-ray department the next day, to lie down in front of a camera.  The results of this will be sent to me.   Based on the results, i hope to order for you a treatment pill of radioactive iodine.  Although it is a larger amount of radiation, you will again notice no symptoms from this.  The pill is gone from your body in a few days (during which you should stay away from other people), but takes several months to work.  Therefore, please return here approximately 6-8 weeks after the treatment.  This treatment has been available for many years, and the only known side-effect is an underactive thyroid.  It is possible that i would eventually prescribe for you a thyroid hormone pill, which is very inexpensive.  You don't have to worry about side-effects of this thyroid hormone pill, because it is the same molecule your thyroid makes. Please come back for a follow-up appointment in 2 months.

## 2017-01-20 MED ORDER — METHIMAZOLE 10 MG PO TABS: 10.0000 mg | ORAL_TABLET | Freq: Every day | ORAL | 2 refills | Status: DC

## 2017-02-19 ENCOUNTER — Ambulatory Visit (INDEPENDENT_AMBULATORY_CARE_PROVIDER_SITE_OTHER): Payer: BLUE CROSS/BLUE SHIELD | Admitting: Physician Assistant

## 2017-02-19 ENCOUNTER — Encounter: Payer: Self-pay | Admitting: Physician Assistant

## 2017-02-19 VITALS — BP 123/80 | HR 80 | Temp 97.9°F | Resp 18 | Ht 62.5 in | Wt 192.0 lb

## 2017-02-19 DIAGNOSIS — Z029 Encounter for administrative examinations, unspecified: Secondary | ICD-10-CM | POA: Diagnosis not present

## 2017-02-19 DIAGNOSIS — E785 Hyperlipidemia, unspecified: Secondary | ICD-10-CM
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
## 2017-02-19 NOTE — Patient Instructions (Signed)
     IF you received an x-ray today, you will receive an invoice from [facility]. Please contact [facility] at [phone] with questions or concerns regarding your invoice.   IF you received labwork today, you will receive an invoice from [facility]. Please contact [facility] at [phone] with questions or concerns regarding your invoice.   Our billing staff will not be able to assist you with questions regarding bills from these companies.  You will be contacted with the lab results as soon as they are available. The fastest way to get your results is to activate your My Chart account. Instructions are located on the last page of this paperwork. If you have not heard from us regarding the results in 2 weeks, please contact this office.     

## 2017-02-20 LAB — LIPID PANEL
Chol/HDL Ratio: 6 ratio — ABNORMAL HIGH (ref 0.0–5.0)
Cholesterol, Total: 233 mg/dL — ABNORMAL HIGH (ref 100–199)
HDL: 39 mg/dL — AB (ref >39)
LDL Calculated: 115 mg/dL — ABNORMAL HIGH (ref 0–99)
TRIGLYCERIDES: 393 mg/dL — AB (ref 0–149)
VLDL Cholesterol Cal: 79 mg/dL — ABNORMAL HIGH (ref 5–40)

## 2017-02-24 ENCOUNTER — Telehealth: Payer: Self-pay

## 2017-02-24 NOTE — Telephone Encounter (Signed)
Call placed to patient per provider request. No answer on phone, left message for patient to return call to this office./ [facility]

## 2017-02-24 NOTE — Telephone Encounter (Signed)
-----   Message from Ofilia NeasMichael L Clark, PA-C sent at 02/22/2017  6:46 PM EDT ----- Not urgent.  Please call him.  Is he taking his medication daily?  Has he missed any doses?  Does he need refills? If he needs refills I am happy to send in.  If he is taking medication consistently I will need to increase his dose.  Deliah BostonMichael Clark, MS, PA-C 6:46 PM, 02/22/2017

## 2017-03-07 ENCOUNTER — Telehealth: Payer: Self-pay

## 2017-03-07 NOTE — Telephone Encounter (Signed)
Second call placed to patient regarding elevated lipid results. No answer on patient phone. Left message for patient to return call to this office, also mailed letter to patient home./ S.Kailand Seda,CMA

## 2017-03-21 ENCOUNTER — Encounter: Payer: Self-pay | Admitting: Endocrinology

## 2017-03-21 ENCOUNTER — Ambulatory Visit (INDEPENDENT_AMBULATORY_CARE_PROVIDER_SITE_OTHER): Payer: BLUE CROSS/BLUE SHIELD | Admitting: Endocrinology

## 2017-03-21 VITALS — BP 122/76 | HR 94 | Ht 62.5 in | Wt 196.0 lb

## 2017-03-21 DIAGNOSIS — E0590 Thyrotoxicosis, unspecified without thyrotoxic crisis or storm: Secondary | ICD-10-CM

## 2017-03-21 LAB — TSH

## 2017-03-21 LAB — T4, FREE: FREE T4: 1.26 ng/dL (ref 0.60–1.60)

## 2017-03-21 MED ORDER — METHIMAZOLE 10 MG PO TABS: 10.0000 mg | ORAL_TABLET | Freq: Two times a day (BID) | ORAL | 2 refills | Status: DC

## 2017-03-21 NOTE — Patient Instructions (Addendum)
blood tests are requested for you today.  We'll let you know about the results. If ever you have fever while taking methimazole, stop it and call us, even if the reason is obvious, because of the risk of a rare side-effect.  Please come back for a follow-up appointment in 3 months.   

## 2017-03-21 NOTE — Progress Notes (Signed)
   Subjective:    Patient ID: Jason Swanson, male    DOB: 1955-11-26, 61 y.o.   MRN: [id number]  HPI Pt returns for f/u of hyperthyroidism (dx'ed 2018; he chooses tapazole, at least for now; he has never had thyroid imaging).  pt states he feels well in general, except for fatigue.   Past Medical History:  Diagnosis Date  . Hyperlipidemia     No past surgical history on file.  Social History   Social History  . Marital status: Married    Spouse name: [name]  . Number of children: 6  . 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 Smoking status: Never Smoker  . Smokeless tobacco: Never Used  . Alcohol use No  . Drug use: No  . Sexual activity: Yes    Partners: Female   Other Topics Concern  . Not on file   Social History Narrative   Lives with his wife and two younger sons.  Older children live in [place].  He is from [place], and came to the [place] in 2010.    Current Outpatient Prescriptions on File Prior to Visit  Medication Sig Dispense Refill  . atorvastatin (LIPITOR) 10 MG tablet Take 1 tablet (10 mg total) by mouth daily. 90 tablet 3   No current facility-administered medications on file prior to visit.     No Known Allergies  Family History  Problem Relation Age of Onset  . Hypertension Father   . Thyroid disease Neg Hx     BP 122/76   Pulse 94   Ht 5' 2.5" (1.588 m)   Wt 196 lb (88.9 kg)   SpO2 94%   BMI 35.28 kg/m    Review of Systems Denies fever.  He has gained weight.      Objective:   Physical Exam VITAL SIGNS:  See vs page.  GENERAL: no distress.   NECK: There is no palpable thyroid enlargement.  No thyroid nodule is palpable.  No palpable lymphadenopathy at the anterior neck.   Lab Results  Component Value Date   TSH <0.01 (L) 03/21/2017      Assessment & Plan:  Hyperthyroidism, worse.  I have sent a prescription to your pharmacy, to increase tapazole.

## 2017-03-26 ENCOUNTER — Other Ambulatory Visit: Payer: Self-pay | Admitting: Emergency Medicine

## 2017-03-26 DIAGNOSIS — Z9189 Other specified personal risk factors, not elsewhere classified: Secondary | ICD-10-CM

## 2017-03-26 MED ORDER — ATORVASTATIN CALCIUM 20 MG PO TABS: 20.0000 mg | ORAL_TABLET | Freq: Every day | ORAL | 0 refills | Status: DC

## 2017-03-26 NOTE — Progress Notes (Unsigned)
Pt came in today for lab results  results given and explained Atorvastatin was increased to 20mg . [facility]

## 2017-06-21 ENCOUNTER — Ambulatory Visit (INDEPENDENT_AMBULATORY_CARE_PROVIDER_SITE_OTHER): Payer: BLUE CROSS/BLUE SHIELD | Admitting: Endocrinology

## 2017-06-21 ENCOUNTER — Encounter: Payer: Self-pay | Admitting: Endocrinology

## 2017-06-21 VITALS — BP 112/74 | HR 72 | Temp 98.2°F | Resp 16 | Ht 63.0 in | Wt 201.0 lb

## 2017-06-21 DIAGNOSIS — E0590 Thyrotoxicosis, unspecified without thyrotoxic crisis or storm: Secondary | ICD-10-CM | POA: Diagnosis not present

## 2017-06-21 LAB — T4, FREE: Free T4: 0.72 ng/dL (ref 0.60–1.60)

## 2017-06-21 LAB — TSH: TSH: 1.84 u[IU]/mL (ref 0.35–4.50)

## 2017-06-21 NOTE — Patient Instructions (Signed)
blood tests are requested for you today.  We'll let you know about the results. If ever you have fever while taking methimazole, stop it and call us, even if the reason is obvious, because of the risk of a rare side-effect.  Please come back for a follow-up appointment in 3 months.   

## 2017-06-21 NOTE — Progress Notes (Signed)
   Subjective:    Patient ID: Jason Swanson, male    DOB: 07-Nov-1955, 61 y.o.   MRN: [id number]  HPI Pt returns for f/u of hyperthyroidism (dx'ed 2018; he chooses tapazole, at least for now; he has never had thyroid imaging).  pt states he feels well in general, except for slight tremor.  He seldom misses the tapazole.  Past Medical History:  Diagnosis Date  . Hyperlipidemia     No past surgical history on file.  Social History   Social History  . Marital status: Married    Spouse name: [name]  . Number of children: 6  . Years of education: 65   Occupational History  . 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 Smoking status: Never Smoker  . Smokeless tobacco: Never Used  . Alcohol use No  . Drug use: No  . Sexual activity: Yes    Partners: Female   Other Topics Concern  . Not on file   Social History Narrative   Lives with his wife and two younger sons.  Older children live in [place].  He is from [place], and came to the [place] in 2010.    Current Outpatient Prescriptions on File Prior to Visit  Medication Sig Dispense Refill  . atorvastatin (LIPITOR) 20 MG tablet Take 1 tablet (20 mg total) by mouth daily. 90 tablet 0  . methimazole (TAPAZOLE) 10 MG tablet Take 1 tablet (10 mg total) by mouth 2 (two) times daily. 180 tablet 2   No current facility-administered medications on file prior to visit.     No Known Allergies  Family History  Problem Relation Age of Onset  . Hypertension Father   . Thyroid disease Neg Hx     BP 112/74 (BP Location: Left Arm, Patient Position: Sitting, Cuff Size: Normal)   Pulse 72   Temp 98.2 F (36.8 C) (Oral)   Resp 16   Ht  (1.6 m)   Wt 201 lb (91.2 kg)   SpO2 97%   BMI 35.61 kg/m    Review of Systems Denies fever.     Objective:   Physical Exam VITAL SIGNS:  See vs page GENERAL: no distress NECK: There is no palpable thyroid enlargement.  No thyroid nodule is palpable.  No palpable  lymphadenopathy at the anterior neck.       Assessment & Plan:  Hyperthyroidism: due for recheck  Patient Instructions  blood tests are requested for you today.  We'll let you know about the results.   If ever you have fever while taking methimazole, stop it and call us, even if the reason is obvious, because of the risk of a rare side-effect.   Please come back for a follow-up appointment in 3 months.

## 2017-07-21 ENCOUNTER — Encounter: Payer: Self-pay | Admitting: Emergency Medicine

## 2017-07-21 ENCOUNTER — Other Ambulatory Visit: Payer: Self-pay

## 2017-07-21 ENCOUNTER — Ambulatory Visit: Payer: BLUE CROSS/BLUE SHIELD | Admitting: Emergency Medicine

## 2017-07-21 VITALS — BP 106/64 | HR 68 | Temp 97.8°F | Resp 16 | Ht 65.75 in | Wt 204.4 lb

## 2017-07-21 DIAGNOSIS — E785 Hyperlipidemia, unspecified: Secondary | ICD-10-CM | POA: Diagnosis not present

## 2017-07-21 DIAGNOSIS — Z23 Encounter for immunization: Secondary | ICD-10-CM | POA: Diagnosis not present

## 2017-07-21 MED ORDER — ATORVASTATIN CALCIUM 40 MG PO TABS: 40.0000 mg | ORAL_TABLET | Freq: Every day | ORAL | 3 refills | Status: DC

## 2017-07-21 NOTE — Patient Instructions (Addendum)
   IF you received an x-ray today, you will receive an invoice from [facility]. Please contact [facility] at [phone] with questions or concerns regarding your invoice.   IF you received labwork today, you will receive an invoice from [facility]. Please contact [facility] at [phone] with questions or concerns regarding your invoice.   Our billing staff will not be able to assist you with questions regarding bills from these companies.  You will be contacted with the lab results as soon as they are available. The fastest way to get your results is to activate your My Chart account. Instructions are located on the last page of this paperwork. If you have not heard from us regarding the results in 2 weeks, please contact this office.      Fat and Cholesterol Restricted Diet Getting too much fat and cholesterol in your diet may cause health problems. Following this diet helps keep your fat and cholesterol at normal levels. This can keep you from getting sick. What types of fat should I choose?  Choose monosaturated and polyunsaturated fats. These are found in foods such as olive oil, canola oil, flaxseeds, walnuts, almonds, and seeds.  Eat more omega-3 fats. Good choices include salmon, mackerel, sardines, tuna, flaxseed oil, and ground flaxseeds.  Limit saturated fats. These are in animal products such as meats, butter, and cream. They can also be in plant products such as palm oil, palm kernel oil, and coconut oil.  Avoid foods with partially hydrogenated oils in them. These contain trans fats. Examples of foods that have trans fats are stick margarine, some tub margarines, cookies, crackers, and other baked goods. What general guidelines do I need to follow?  Check food labels. Look for the words "trans fat" and "saturated fat."  When preparing a meal: ? Fill half of your plate with vegetables and green salads. ? Fill one fourth of your plate with whole  grains. Look for the word "whole" as the first word in the ingredient list. ? Fill one fourth of your plate with lean protein foods.  Eat more foods that have fiber, like apples, carrots, beans, peas, and barley.  Eat more home-cooked foods. Eat less at restaurants and buffets.  Limit or avoid alcohol.  Limit foods high in starch and sugar.  Limit fried foods.  Cook foods without frying them. Baking, boiling, grilling, and broiling are all great options.  Lose weight if you are overweight. Losing even a small amount of weight can help your overall health. It can also help prevent diseases such as diabetes and heart disease. What foods can I eat? Grains Whole grains, such as whole wheat or whole grain breads, crackers, cereals, and pasta. Unsweetened oatmeal, bulgur, barley, quinoa, or brown rice. Corn or whole wheat flour tortillas. Vegetables Fresh or frozen vegetables (raw, steamed, roasted, or grilled). Green salads. Fruits All fresh, canned (in natural juice), or frozen fruits. Meat and Other Protein Products Ground beef (85% or leaner), grass-fed beef, or beef trimmed of fat. Skinless chicken or turkey. Ground chicken or turkey. Pork trimmed of fat. All fish and seafood. Eggs. Dried beans, peas, or lentils. Unsalted nuts or seeds. Unsalted canned or dry beans. Dairy Low-fat dairy products, such as skim or 1% milk, 2% or reduced-fat cheeses, low-fat ricotta or cottage cheese, or plain low-fat yogurt. Fats and Oils Tub margarines without trans fats. Light or reduced-fat mayonnaise and salad dressings. Avocado. Olive, canola, sesame, or safflower oils. Natural peanut or almond butter (choose ones without   added sugar and oil). The items listed above may not be a complete list of recommended foods or beverages. Contact your dietitian for more options. What foods are not recommended? Grains White bread. White pasta. White rice. Cornbread. Bagels, pastries, and croissants. Crackers that  contain trans fat. Vegetables White potatoes. Corn. Creamed or fried vegetables. Vegetables in a cheese sauce. Fruits Dried fruits. Canned fruit in light or heavy syrup. Fruit juice. Meat and Other Protein Products Fatty cuts of meat. Ribs, chicken wings, bacon, sausage, bologna, salami, chitterlings, fatback, hot dogs, bratwurst, and packaged luncheon meats. Liver and organ meats. Dairy Whole or 2% milk, cream, half-and-half, and cream cheese. Whole milk cheeses. Whole-fat or sweetened yogurt. Full-fat cheeses. Nondairy creamers and whipped toppings. Processed cheese, cheese spreads, or cheese curds. Sweets and Desserts Corn syrup, sugars, honey, and molasses. Candy. Jam and jelly. Syrup. Sweetened cereals. Cookies, pies, cakes, donuts, muffins, and ice cream. Fats and Oils Butter, stick margarine, lard, shortening, ghee, or bacon fat. Coconut, palm kernel, or palm oils. Beverages Alcohol. Sweetened drinks (such as sodas, lemonade, and fruit drinks or punches). The items listed above may not be a complete list of foods and beverages to avoid. Contact your dietitian for more information. This information is not intended to replace advice given to you by your health care provider. Make sure you discuss any questions you have with your health care provider. Document Released: 02/27/2012 Document Revised: 05/04/2016 Document Reviewed: 11/27/2013 Elsevier Interactive Patient Education  2018 [employer]  

## 2017-07-21 NOTE — Progress Notes (Signed)
Jason Swanson 61 y.o.   Chief Complaint  Patient presents with  . Medication Refill    Atorvastatin    HISTORY OF PRESENT ILLNESS: This is a 61 y.o. male with h/o high cholesterol ran out of Atorvastatin; asymptomatic; no complaints.  HPI   Prior to Admission medications   Medication Sig Start Date End Date Taking? Authorizing Provider  atorvastatin (LIPITOR) 20 MG tablet Take 1 tablet (20 mg total) by mouth daily. 03/26/17  Yes Ofilia Neaslark, Michael L, PA-C  methimazole (TAPAZOLE) 10 MG tablet Take 1 tablet (10 mg total) by mouth 2 (two) times daily. 03/21/17  Yes Romero BellingEllison, Sean, MD    No Known Allergies  Patient Active Problem List   Diagnosis Date Noted  . Hyperthyroidism 11/06/2016  . Hyperlipidemia 11/18/2011    Past Medical History:  Diagnosis Date  . Hyperlipidemia     No past surgical history on file.  Social History   Socioeconomic History  . Marital status: Married    Spouse name: [name]  . Number of children: 6  . Years of education: 3016  . Highest education level: Not on file  Social Needs  . Financial resource strain: Not on file  . Food insecurity - worry: Not on file  . Food insecurity - inability: Not on file  . Transportation needs - medical: Not on file  . Transportation needs - non-medical: Not on file  Occupational History  . Occupation: OPERATOR    Employer: [employer]  Tobacco Use  . Smoking status: Never Smoker  . Smokeless tobacco: Never Used  Substance and Sexual Activity  . Alcohol use: No  . Drug use: No  . Sexual activity: Yes    Partners: Female  Other Topics Concern  . Not on file  Social History Narrative   Lives with his wife and two younger sons.  Older children live in [place].  He is from [place], and came to the [place] in [place].    Family History  Problem Relation Age of Onset  . Hypertension Father   . Thyroid disease Neg Hx      Review of Systems  Constitutional: Negative.  Negative for chills and  fever.  HENT: Negative.   Respiratory: Negative.  Negative for cough and shortness of breath.   Cardiovascular: Negative.  Negative for chest pain and palpitations.  Gastrointestinal: Negative.  Negative for abdominal pain, diarrhea, nausea and vomiting.  Genitourinary: Negative.  Negative for dysuria and hematuria.  Musculoskeletal: Negative for back pain, myalgias and neck pain.  Skin: Negative.  Negative for rash.  Neurological: Negative.  Negative for dizziness and headaches.  Endo/Heme/Allergies: Negative.   All other systems reviewed and are negative.  Vitals:   07/21/17 1206  BP: 106/64  Pulse: 68  Resp: 16  Temp: 97.8 F (36.6 C)  SpO2: 98%     Physical Exam  Constitutional: He is oriented to person, place, and time. He appears well-developed and well-nourished.  HENT:  Head: Normocephalic and atraumatic.  Eyes: EOM are normal. Pupils are equal, round, and reactive to light.  Neck: Normal range of motion. Neck supple.  Cardiovascular: Normal rate, regular rhythm and normal heart sounds.  Pulmonary/Chest: Effort normal and breath sounds normal.  Musculoskeletal: Normal range of motion.  Neurological: He is alert and oriented to person, place, and time. No sensory deficit. He exhibits normal muscle tone.  Skin: Skin is warm and dry. Capillary refill takes less than 2 seconds. No rash noted.  Psychiatric: He has a  normal mood and affect. His behavior is normal.  Vitals reviewed.    ASSESSMENT & PLAN: Jason Swanson was seen today for medication refill.  Diagnoses and all orders for this visit:  Hyperlipidemia, unspecified hyperlipidemia type  Need for prophylactic vaccination and inoculation against influenza -     Flu Vaccine QUAD 6+ mos PF IM (Fluarix Quad PF)  Other orders -     atorvastatin (LIPITOR) 40 MG tablet; Take 1 tablet (40 mg total) daily by mouth.    Patient Instructions       IF you received an x-ray today, you will receive an invoice from  [facility]. Please contact [facility] at [phone] with questions or concerns regarding your invoice.   IF you received labwork today, you will receive an invoice from [facility]. Please contact [facility] at [phone] with questions or concerns regarding your invoice.   Our billing staff will not be able to assist you with questions regarding bills from these companies.  You will be contacted with the lab results as soon as they are available. The fastest way to get your results is to activate your My Chart account. Instructions are located on the last page of this paperwork. If you have not heard from us regarding the results in 2 weeks, please contact this office.     Fat and Cholesterol Restricted Diet Getting too much fat and cholesterol in your diet may cause health problems. Following this diet helps keep your fat and cholesterol at normal levels. This can keep you from getting sick. What types of fat should I choose?  Choose monosaturated and polyunsaturated fats. These are found in foods such as olive oil, canola oil, flaxseeds, walnuts, almonds, and seeds.  Eat more omega-3 fats. Good choices include salmon, mackerel, sardines, tuna, flaxseed oil, and ground flaxseeds.  Limit saturated fats. These are in animal products such as meats, butter, and cream. They can also be in plant products such as palm oil, palm kernel oil, and coconut oil.  Avoid foods with partially hydrogenated oils in them. These contain trans fats. Examples of foods that have trans fats are stick margarine, some tub margarines, cookies, crackers, and other baked goods. What general guidelines do I need to follow?  Check food labels. Look for the words "trans fat" and "saturated fat."  When preparing a meal: ? Fill half of your plate with vegetables and green salads. ? Fill one fourth of your plate with whole grains. Look for the word "whole" as the first word in the ingredient  list. ? Fill one fourth of your plate with lean protein foods.  Eat more foods that have fiber, like apples, carrots, beans, peas, and barley.  Eat more home-cooked foods. Eat less at restaurants and buffets.  Limit or avoid alcohol.  Limit foods high in starch and sugar.  Limit fried foods.  Cook foods without frying them. Baking, boiling, grilling, and broiling are all great options.  Lose weight if you are overweight. Losing even a small amount of weight can help your overall health. It can also help prevent diseases such as diabetes and heart disease. What foods can I eat? Grains Whole grains, such as whole wheat or whole grain breads, crackers, cereals, and pasta. Unsweetened oatmeal, bulgur, barley, quinoa, or brown rice. Corn or whole wheat flour tortillas. Vegetables Fresh or frozen vegetables (raw, steamed, roasted, or grilled). Green salads. Fruits All fresh, canned (in natural juice), or frozen fruits. Meat and Other Protein Products Ground beef (85% or leaner),  grass-fed beef, or beef trimmed of fat. Skinless chicken or [place]. Ground chicken or [place]. Pork trimmed of fat. All fish and seafood. Eggs. Dried beans, peas, or lentils. Unsalted nuts or seeds. Unsalted canned or dry beans. Dairy Low-fat dairy products, such as skim or 1% milk, 2% or reduced-fat cheeses, low-fat ricotta or cottage cheese, or plain low-fat yogurt. Fats and Oils Tub margarines without trans fats. Light or reduced-fat mayonnaise and salad dressings. Avocado. Olive, canola, sesame, or safflower oils. Natural peanut or almond butter (choose ones without added sugar and oil). The items listed above may not be a complete list of recommended foods or beverages. Contact your dietitian for more options. What foods are not recommended? Grains White bread. White pasta. White rice. Cornbread. Bagels, pastries, and croissants. Crackers that contain trans fat. Vegetables White potatoes. Corn. Creamed or  fried vegetables. Vegetables in a cheese sauce. Fruits Dried fruits. Canned fruit in light or heavy syrup. Fruit juice. Meat and Other Protein Products Fatty cuts of meat. Ribs, chicken wings, bacon, sausage, bologna, salami, chitterlings, fatback, hot dogs, bratwurst, and packaged luncheon meats. Liver and organ meats. Dairy Whole or 2% milk, cream, half-and-half, and cream cheese. Whole milk cheeses. Whole-fat or sweetened yogurt. Full-fat cheeses. Nondairy creamers and whipped toppings. Processed cheese, cheese spreads, or cheese curds. Sweets and Desserts Corn syrup, sugars, honey, and molasses. Candy. Jam and jelly. Syrup. Sweetened cereals. Cookies, pies, cakes, donuts, muffins, and ice cream. Fats and Oils Butter, stick margarine, lard, shortening, ghee, or bacon fat. Coconut, palm kernel, or palm oils. Beverages Alcohol. Sweetened drinks (such as sodas, lemonade, and fruit drinks or punches). The items listed above may not be a complete list of foods and beverages to avoid. Contact your dietitian for more information. This information is not intended to replace advice given to you by your health care provider. Make sure you discuss any questions you have with your health care provider. Document Released: 02/27/2012 Document Revised: 05/04/2016 Document Reviewed: 11/27/2013 Elsevier Interactive Patient Education  2018 [employer].      Edwina Barth, MD [facility]

## 2017-07-25 ENCOUNTER — Other Ambulatory Visit: Payer: Self-pay

## 2017-07-25 ENCOUNTER — Telehealth: Payer: Self-pay | Admitting: Endocrinology

## 2017-07-25 MED ORDER — ATORVASTATIN CALCIUM 40 MG PO TABS: 40.0000 mg | ORAL_TABLET | Freq: Every day | ORAL | 3 refills | Status: DC

## 2017-07-25 NOTE — Telephone Encounter (Signed)
Patient a need atorvastatin (LIPITOR) 40 MG tablet send to [facility] [address] - [place], [place] - [id number]

## 2017-07-25 NOTE — Telephone Encounter (Signed)
Done

## 2017-09-21 ENCOUNTER — Ambulatory Visit: Payer: Self-pay | Admitting: Endocrinology

## 2017-10-23 ENCOUNTER — Encounter: Payer: Self-pay | Admitting: Endocrinology

## 2017-10-23 ENCOUNTER — Ambulatory Visit: Payer: BLUE CROSS/BLUE SHIELD | Admitting: Endocrinology

## 2017-10-23 VITALS — BP 120/78 | HR 75 | Wt 203.8 lb

## 2017-10-23 DIAGNOSIS — E0590 Thyrotoxicosis, unspecified without thyrotoxic crisis or storm: Secondary | ICD-10-CM | POA: Diagnosis not present

## 2017-10-23 NOTE — Patient Instructions (Addendum)
blood tests are requested for you today.  We'll let you know about the results. If ever you have fever while taking methimazole, stop it and call us, even if the reason is obvious, because of the risk of a rare side-effect.   Please come back for a follow-up appointment in 4-5 months.  

## 2017-10-23 NOTE — Progress Notes (Signed)
   Subjective:    Patient ID: Jason Swanson, male    DOB: November 17, 1955, 62 y.o.   MRN: [id number]  HPI Pt returns for f/u of hyperthyroidism (dx'ed 2018; he chooses tapazole, at least for now; he has never had thyroid imaging).  pt states he feels well in general, except for slight headache.  He seldom misses the tapazole.  Past Medical History:  Diagnosis Date  . Hyperlipidemia     History reviewed. No pertinent surgical history.  Social History   Socioeconomic History  . Marital status: Married    Spouse name: [name]  . Number of children: 6  . Years of education: 6416  . Highest education level: Not on file  Social Needs  . Financial resource strain: Not on file  . Food insecurity - worry: Not on file  . Food insecurity - inability: Not on file  . Transportation needs - medical: Not on file  . Transportation needs - non-medical: Not on file  Occupational History  . Occupation: OPERATOR    Employer: [employer]  Tobacco Use  . Smoking status: Never Smoker  . Smokeless tobacco: Never Used  Substance and Sexual Activity  . Alcohol use: No  . Drug use: No  . Sexual activity: Yes    Partners: Female  Other Topics Concern  . Not on file  Social History Narrative   Lives with his wife and two younger sons.  Older children live in [place].  He is from [place], and came to the [place] in [place].    Current Outpatient Medications on File Prior to Visit  Medication Sig Dispense Refill  . atorvastatin (LIPITOR) 40 MG tablet Take 1 tablet (40 mg total) daily by mouth. 90 tablet 3  . methimazole (TAPAZOLE) 10 MG tablet Take 1 tablet (10 mg total) by mouth 2 (two) times daily. 180 tablet 2   No current facility-administered medications on file prior to visit.     No Known Allergies  Family History  Problem Relation Age of Onset  . Hypertension Father   . Thyroid disease Neg Hx     BP 120/78 (BP Location: Left Arm, Patient Position: Sitting, Cuff Size: Normal)    Pulse 75   Wt 203 lb 12.8 oz (92.4 kg)   SpO2 96%   BMI 33.14 kg/m    Review of Systems Denies fever.     Objective:   Physical Exam VITAL SIGNS:  See vs page GENERAL: no distress NECK: There is no palpable thyroid enlargement.  No thyroid nodule is palpable.  No palpable lymphadenopathy at the anterior neck.      Assessment & Plan:  Hyperthyroidism, due for recheck.  Patient Instructions  blood tests are requested for you today.  We'll let you know about the results.   If ever you have fever while taking methimazole, stop it and call us, even if the reason is obvious, because of the risk of a rare side-effect.   Please come back for a follow-up appointment in 4-5 months.

## 2017-10-24 LAB — T4, FREE: FREE T4: 0.6 ng/dL (ref 0.60–1.60)

## 2017-10-24 LAB — TSH: TSH: 2.84 u[IU]/mL (ref 0.35–4.50)

## 2017-10-25 ENCOUNTER — Other Ambulatory Visit: Payer: Self-pay

## 2017-10-25 MED ORDER — METHIMAZOLE 10 MG PO TABS: 10.0000 mg | ORAL_TABLET | Freq: Two times a day (BID) | ORAL | 2 refills | Status: DC

## 2017-10-29 ENCOUNTER — Other Ambulatory Visit: Payer: Self-pay

## 2017-11-24 ENCOUNTER — Encounter: Payer: Self-pay | Admitting: Urgent Care

## 2017-11-24 ENCOUNTER — Other Ambulatory Visit: Payer: Self-pay

## 2017-11-24 ENCOUNTER — Ambulatory Visit: Payer: BLUE CROSS/BLUE SHIELD | Admitting: Urgent Care

## 2017-11-24 ENCOUNTER — Ambulatory Visit (INDEPENDENT_AMBULATORY_CARE_PROVIDER_SITE_OTHER): Payer: BLUE CROSS/BLUE SHIELD

## 2017-11-24 VITALS — BP 112/64 | HR 70 | Temp 97.4°F | Resp 16 | Ht 65.75 in | Wt 204.8 lb

## 2017-11-24 DIAGNOSIS — R109 Unspecified abdominal pain: Secondary | ICD-10-CM | POA: Diagnosis not present

## 2017-11-24 DIAGNOSIS — M545 Low back pain, unspecified: Secondary | ICD-10-CM

## 2017-11-24 DIAGNOSIS — E782 Mixed hyperlipidemia: Secondary | ICD-10-CM

## 2017-11-24 DIAGNOSIS — R319 Hematuria, unspecified: Secondary | ICD-10-CM

## 2017-11-24 DIAGNOSIS — G8929 Other chronic pain: Secondary | ICD-10-CM | POA: Diagnosis not present

## 2017-11-24 LAB — POCT URINALYSIS DIP (MANUAL ENTRY)
Bilirubin, UA: NEGATIVE
Glucose, UA: NEGATIVE mg/dL
Ketones, POC UA: NEGATIVE mg/dL
LEUKOCYTES UA: NEGATIVE
NITRITE UA: NEGATIVE
PROTEIN UA: NEGATIVE mg/dL
Spec Grav, UA: 1.025 (ref 1.010–1.025)
UROBILINOGEN UA: 0.2 U/dL
pH, UA: 6 (ref 5.0–8.0)

## 2017-11-24 NOTE — Progress Notes (Signed)
    MRN: [id number] DOB: [birth year]  Subjective:   Jason Swanson is a 62 y.o. male presenting for 6 month history of intermittent right flank pain. Denies fever, falls, trauma, hematuria, dysuria, urinary frequency, n/v, abdominal pain, radicular pain, back pain. Drinks 1-2 cups of coffee per day. Denies smoking cigarettes or drinking alcohol. Does not hydrate well. Denies history of renal stones.   Jason Swanson has a current medication list which includes the following prescription(s): atorvastatin and methimazole. Also has No Known Allergies.  Jason Swanson  has a past medical history of Hyperlipidemia and Hyperthyroidism. Denies past surgical history.  Objective:   Vitals: BP 112/64   Pulse 70   Temp (!) 97.4 F (36.3 C)   Resp 16   Ht 5' 5.75" (1.67 m)   Wt 204 lb 12.8 oz (92.9 kg)   SpO2 96%   BMI 33.31 kg/m   Physical Exam  Constitutional: He is oriented to person, place, and time. He appears well-developed and well-nourished.  Cardiovascular: Normal rate.  Pulmonary/Chest: Effort normal.  Abdominal: Soft. Bowel sounds are normal. He exhibits no distension and no mass. There is no tenderness. There is no guarding.  Musculoskeletal:       Lumbar back: He exhibits tenderness (over right flank side with ROM testing but not over back, lumbar paraspinal muscles). He exhibits normal range of motion, no bony tenderness, no swelling, no edema, no deformity and no spasm.  Neurological: He is alert and oriented to person, place, and time. He displays normal reflexes. Coordination normal.  Skin: Skin is warm and dry.  Psychiatric: He has a normal mood and affect.   Results for orders placed or performed in visit on 11/24/17 (from the past 24 hour(s))  POCT urinalysis dipstick     Status: Abnormal   Collection Time: 11/24/17  9:28 AM  Result Value Ref Range   Color, UA yellow yellow   Clarity, UA clear clear   Glucose, UA negative negative mg/dL   Bilirubin, UA negative negative   Ketones,  POC UA negative negative mg/dL   Spec Grav, UA 4.0981.025 1.1911.010 - 1.025   Blood, UA moderate (A) negative   pH, UA 6.0 5.0 - 8.0   Protein Ur, POC negative negative mg/dL   Urobilinogen, UA 0.2 0.2 or 1.0 E.U./dL   Nitrite, UA Negative Negative   Leukocytes, UA Negative Negative   Dg Lumbar Spine Complete  Result Date: 11/24/2017 CLINICAL DATA:  Chronic right-sided low back pain. EXAM: LUMBAR SPINE - COMPLETE 4+ VIEW COMPARISON:  None. FINDINGS: Five lumbar type vertebral bodies. No acute fracture or subluxation. Vertebral body heights are preserved. Alignment is normal. Scattered small anterior endplate osteophytes. Mild disc height loss in the lower thoracic spine. Lumbar intervertebral disc spaces are maintained. The sacroiliac joints are intact. IMPRESSION: 1. No acute osseous abnormality or significant degenerative changes in the lumbar spine. Electronically Signed   By: Obie DredgeWilliam T Derry M.D.   On: 11/24/2017 09:50   Assessment and Plan :   Right flank pain - Plan: DG Lumbar Spine Complete, POCT urinalysis dipstick, Comprehensive metabolic panel  Hematuria, unspecified type - Plan: Comprehensive metabolic panel, Urine Microscopic  Mixed hyperlipidemia - Plan: Lipid panel  Suspect possible renal stone. Recommended patient hydrate better. Labs pending. Return-to-clinic precautions discussed, patient verbalized understanding.    Jason BambergMario Durenda Pechacek, PA-C [facility] [phone] 11/24/2017 9:16 AM

## 2017-11-24 NOTE — Patient Instructions (Addendum)
Hydrate well with at least 2 liters (1 gallon) of water daily. You may take 500mg  Tylenol every 6 hours for pain and inflammation.     Flank Pain, Adult Flank pain is pain that is located on the side of the body between the upper abdomen and the back. This area is called the flank. The pain may occur over a short period of time (acute), or it may be long-term or recurring (chronic). It may be mild or severe. Flank pain can be caused by many things, including:  Muscle soreness or injury.  Kidney stones or kidney disease.  Stress.  A disease of the spine (vertebral disk disease).  A lung infection (pneumonia).  Fluid around the lungs (pulmonary edema).  A skin rash caused by the chickenpox virus (shingles).  Tumors that affect the back of the abdomen.  Gallbladder disease.  Follow these instructions at home:  Drink enough fluid to keep your urine clear or pale yellow.  Rest as told by your health care provider.  Take over-the-counter and prescription medicines only as told by your health care provider.  Keep a journal to track what has caused your flank pain and what has made it feel better.  Keep all follow-up visits as told by your health care provider. This is important. Contact a health care provider if:  Your pain is not controlled with medicine.  You have new symptoms.  Your pain gets worse.  You have a fever.  Your symptoms last longer than 2-3 days.  You have trouble urinating or you are urinating very frequently. Get help right away if:  You have trouble breathing or you are short of breath.  Your abdomen hurts or it is swollen or red.  You have nausea or vomiting.  You feel faint or you pass out.  You have blood in your urine. Summary  Flank pain is pain that is located on the side of the body between the upper abdomen and the back.  The pain may occur over a short period of time (acute), or it may be long-term or recurring (chronic). It may be  mild or severe.  Flank pain can be caused by many things.  Contact your health care provider if your symptoms get worse or they last longer than 2-3 days. This information is not intended to replace advice given to you by your health care provider. Make sure you discuss any questions you have with your health care provider. Document Released: 10/19/2005 Document Revised: 11/10/2016 Document Reviewed: 11/10/2016 Elsevier Interactive Patient Education  2018 [employer]     IF you received an x-ray today, you will receive an invoice from [facility]. Please contact [facility] at [phone] with questions or concerns regarding your invoice.   IF you received labwork today, you will receive an invoice from [facility]. Please contact [facility] at [phone] with questions or concerns regarding your invoice.   Our billing staff will not be able to assist you with questions regarding bills from these companies.  You will be contacted with the lab results as soon as they are available. The fastest way to get your results is to activate your My Chart account. Instructions are located on the last page of this paperwork. If you have not heard from us regarding the results in 2 weeks, please contact this office.

## 2017-11-25 LAB — COMPREHENSIVE METABOLIC PANEL
ALK PHOS: 109 IU/L (ref 39–117)
ALT: 27 IU/L (ref 0–44)
AST: 20 IU/L (ref 0–40)
Albumin/Globulin Ratio: 1.6 (ref 1.2–2.2)
Albumin: 4.7 g/dL (ref 3.6–4.8)
BUN/Creatinine Ratio: 13 (ref 10–24)
BUN: 16 mg/dL (ref 8–27)
Bilirubin Total: 1.1 mg/dL (ref 0.0–1.2)
CO2: 24 mmol/L (ref 20–29)
CREATININE: 1.21 mg/dL (ref 0.76–1.27)
Calcium: 9.3 mg/dL (ref 8.6–10.2)
Chloride: 105 mmol/L (ref 96–106)
GFR calc Af Amer: 75 mL/min/{1.73_m2} (ref >59)
GFR calc non Af Amer: 65 mL/min/{1.73_m2} (ref >59)
Globulin, Total: 2.9 g/dL (ref 1.5–4.5)
Glucose: 109 mg/dL — ABNORMAL HIGH (ref 65–99)
Potassium: 5.2 mmol/L (ref 3.5–5.2)
SODIUM: 144 mmol/L (ref 134–144)
Total Protein: 7.6 g/dL (ref 6.0–8.5)

## 2017-11-25 LAB — URINALYSIS, MICROSCOPIC ONLY
Bacteria, UA: NONE SEEN
CASTS: NONE SEEN /LPF

## 2017-11-25 LAB — LIPID PANEL
CHOLESTEROL TOTAL: 156 mg/dL (ref 100–199)
Chol/HDL Ratio: 3.8 ratio (ref 0.0–5.0)
HDL: 41 mg/dL (ref >39)
LDL Calculated: 87 mg/dL (ref 0–99)
TRIGLYCERIDES: 142 mg/dL (ref 0–149)
VLDL Cholesterol Cal: 28 mg/dL (ref 5–40)

## 2017-12-03 ENCOUNTER — Encounter: Payer: Self-pay | Admitting: *Deleted

## 2018-02-23 ENCOUNTER — Encounter: Payer: Self-pay | Admitting: Urgent Care

## 2018-02-23 ENCOUNTER — Other Ambulatory Visit: Payer: Self-pay

## 2018-02-23 ENCOUNTER — Ambulatory Visit: Payer: BLUE CROSS/BLUE SHIELD | Admitting: Urgent Care

## 2018-02-23 VITALS — BP 112/74 | HR 72 | Temp 98.5°F | Resp 16 | Ht 65.5 in | Wt 204.0 lb

## 2018-02-23 DIAGNOSIS — R109 Unspecified abdominal pain: Secondary | ICD-10-CM | POA: Diagnosis not present

## 2018-02-23 DIAGNOSIS — R1084 Generalized abdominal pain: Secondary | ICD-10-CM | POA: Diagnosis not present

## 2018-02-23 LAB — POCT URINALYSIS DIP (MANUAL ENTRY)
BILIRUBIN UA: NEGATIVE
GLUCOSE UA: NEGATIVE mg/dL
Ketones, POC UA: NEGATIVE mg/dL
Leukocytes, UA: NEGATIVE
Nitrite, UA: NEGATIVE
Protein Ur, POC: NEGATIVE mg/dL
SPEC GRAV UA: 1.025 (ref 1.010–1.025)
Urobilinogen, UA: 0.2 E.U./dL
pH, UA: 6 (ref 5.0–8.0)

## 2018-02-23 MED ORDER — MELOXICAM 7.5 MG PO TABS: 7.5000 mg | ORAL_TABLET | Freq: Every day | ORAL | 0 refills | Status: DC

## 2018-02-23 MED ORDER — CYCLOBENZAPRINE HCL 5 MG PO TABS: 5.0000 mg | ORAL_TABLET | Freq: Every day | ORAL | 1 refills | Status: DC

## 2018-02-23 NOTE — Patient Instructions (Addendum)
Hydrate well with at least 2 liters (1 gallon) of water daily.    Abdominal Pain, Adult Abdominal pain can be caused by many things. Often, abdominal pain is not serious and it gets better with no treatment or by being treated at home. However, sometimes abdominal pain is serious. Your health care provider will do a medical history and a physical exam to try to determine the cause of your abdominal pain. Follow these instructions at home:  Take over-the-counter and prescription medicines only as told by your health care provider. Do not take a laxative unless told by your health care provider.  Drink enough fluid to keep your urine clear or pale yellow.  Watch your condition for any changes.  Keep all follow-up visits as told by your health care provider. This is important. Contact a health care provider if:  Your abdominal pain changes or gets worse.  You are not hungry or you lose weight without trying.  You are constipated or have diarrhea for more than 2-3 days.  You have pain when you urinate or have a bowel movement.  Your abdominal pain wakes you up at night.  Your pain gets worse with meals, after eating, or with certain foods.  You are throwing up and cannot keep anything down.  You have a fever. Get help right away if:  Your pain does not go away as soon as your health care provider told you to expect.  You cannot stop throwing up.  Your pain is only in areas of the abdomen, such as the right side or the left lower portion of the abdomen.  You have bloody or black stools, or stools that look like tar.  You have severe pain, cramping, or bloating in your abdomen.  You have signs of dehydration, such as: ? Dark urine, very little urine, or no urine. ? Cracked lips. ? Dry mouth. ? Sunken eyes. ? Sleepiness. ? Weakness. This information is not intended to replace advice given to you by your health care provider. Make sure you discuss any questions you have with  your health care provider. Document Released: 06/07/2005 Document Revised: 03/17/2016 Document Reviewed: 02/09/2016 Elsevier Interactive Patient Education  2018 [employer]     IF you received an x-ray today, you will receive an invoice from [facility]. Please contact [facility] at [phone] with questions or concerns regarding your invoice.   IF you received labwork today, you will receive an invoice from [facility]. Please contact [facility] at [phone] with questions or concerns regarding your invoice.   Our billing staff will not be able to assist you with questions regarding bills from these companies.  You will be contacted with the lab results as soon as they are available. The fastest way to get your results is to activate your My Chart account. Instructions are located on the last page of this paperwork. If you have not heard from us regarding the results in 2 weeks, please contact this office.

## 2018-02-23 NOTE — Progress Notes (Signed)
MRN: [id number] DOB: 03/23/1956  Subjective:   Jason Swanson is a 62 y.o. male presenting for 4784-month history of persistent right-sided abdominal pain.  Patient states that initially felt like it was more superficial and was told that everything was fine with his labs and imaging.  But now he feels that it is more internal type of abdominal pain.  Reports that the pain is not happening every day and is worse at night, is sharp and over right side, feels like there is a fullness.  Denies fever, jaundice, nausea, vomiting, dysuria, hematuria, constipation.  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  He has had a colonoscopy already and is not due for another 3 to 5 years.  States that it was normal.  He has not tried medications consistently for his pain does not hydrate well.  He is currently taking a cholesterol medicine and methimazole for his thyroid.  Denies smoking cigarettes or drinking alcohol.  Denies history of liver or gallbladder disease.  Denies history of kidney disease.  We have seen this patient before for the same and his lab work-up has been negative.  He has also had a negative lumbar x-ray.  Jason Swanson has a current medication list which includes the following prescription(s): atorvastatin and methimazole. Also has No Known Allergies.  Jason Swanson  has a past medical history of Hyperlipidemia and Hyperthyroidism. Denies surgical history.   Objective:   Vitals: BP 112/74 (BP Location: Left Arm, Patient Position: Sitting, Cuff Size: Normal)   Pulse 72   Temp 98.5 F (36.9 C) (Oral)   Resp 16   Ht 5' 5.5" (1.664 m)   Wt 204 lb (92.5 kg)   SpO2 96%   BMI 33.43 kg/m   Physical Exam  Constitutional: He is oriented to person, place, and time. He appears well-developed and well-nourished.  HENT:  Mouth/Throat: Oropharynx is clear and moist.  Eyes: No scleral icterus.  Cardiovascular: Normal rate, regular rhythm and intact distal pulses. Exam reveals no  gallop and no friction rub.  No murmur heard. Pulmonary/Chest: No respiratory distress. He has no wheezes. He has no rales.  Abdominal: Soft. Bowel sounds are normal. He exhibits no distension and no mass. There is no hepatosplenomegaly. There is no tenderness. There is no rebound, no guarding and no CVA tenderness.    Neurological: He is alert and oriented to person, place, and time.  Skin: Skin is warm and dry.  Psychiatric: He has a normal mood and affect.    Results for orders placed or performed in visit on 02/23/18 (from the past 24 hour(s))  POCT urinalysis dipstick     Status: Abnormal   Collection Time: 02/23/18  2:33 PM  Result Value Ref Range   Color, UA yellow yellow   Clarity, UA clear clear   Glucose, UA negative negative mg/dL   Bilirubin, UA negative negative   Ketones, POC UA negative negative mg/dL   Spec Grav, UA 1.9141.025 7.8291.010 - 1.025   Blood, UA small (A) negative   pH, UA 6.0 5.0 - 8.0   Protein Ur, POC negative negative mg/dL   Urobilinogen, UA 0.2 0.2 or 1.0 E.U./dL   Nitrite, UA Negative Negative   Leukocytes, UA Negative Negative   Assessment and Plan :   Right sided abdominal pain - Plan: Basic metabolic panel  Right flank pain - Plan: POCT urinalysis dipstick, Urine Microscopic, Basic metabolic panel  Generalized abdominal pain - Plan: Basic metabolic panel, CT Abdomen Pelvis  W Contrast  Undifferentiated right-sided abdominal pain.  Will try to rule out organic cause through CT scan has all labs have been negative.  Will use meloxicam and Flexeril to address a musculoskeletal source.  Return to clinic precautions discussed.  Patient provided me with his son's phone number and his wife's phone number in case we are unable to reach him. These are [phone], Terese Door, and [phone], [place]se[place]fe, for his son and wife respectively.  Wallis Bamberg, PA-C [facility] [phone] 02/23/2018  2:12 PM

## 2018-02-25 ENCOUNTER — Ambulatory Visit (INDEPENDENT_AMBULATORY_CARE_PROVIDER_SITE_OTHER): Payer: BLUE CROSS/BLUE SHIELD | Admitting: Urgent Care

## 2018-02-25 DIAGNOSIS — R109 Unspecified abdominal pain: Secondary | ICD-10-CM

## 2018-02-25 NOTE — Progress Notes (Signed)
Pt came for a lab only visit. Pt did not see a provider.  

## 2018-02-26 LAB — BASIC METABOLIC PANEL
BUN/Creatinine Ratio: 10 (ref 10–24)
BUN: 11 mg/dL (ref 8–27)
CO2: 23 mmol/L (ref 20–29)
Calcium: 9 mg/dL (ref 8.6–10.2)
Chloride: 106 mmol/L (ref 96–106)
Creatinine, Ser: 1.13 mg/dL (ref 0.76–1.27)
GFR, EST AFRICAN AMERICAN: 81 mL/min/{1.73_m2} (ref >59)
GFR, EST NON AFRICAN AMERICAN: 70 mL/min/{1.73_m2} (ref >59)
Glucose: 91 mg/dL (ref 65–99)
POTASSIUM: 3.8 mmol/L (ref 3.5–5.2)
SODIUM: 141 mmol/L (ref 134–144)

## 2018-02-26 LAB — URINALYSIS, MICROSCOPIC ONLY: CASTS: NONE SEEN /LPF

## 2018-02-27 ENCOUNTER — Telehealth: Payer: Self-pay | Admitting: Urgent Care

## 2018-02-27 NOTE — Telephone Encounter (Signed)
Started prior auth for CT Abdomen Pelvis W/ Contrast but was unable to get this approved even after speaking with nurse reviewer due to there being no rule out of what provider is looking for specifically. If we can get a rule out, I can call back and speak with the nurse again. Otherwise, provider can call [phone] to do a peer to peer. Thanks!

## 2018-03-01 NOTE — Telephone Encounter (Signed)
Spoke with nurse reviewer and [facility]. They are still unable to approve CT and are requesting a peer to peer be done by calling [phone]. Member's ID number is [id number] and DOB: July 18, 1956.

## 2018-03-01 NOTE — Telephone Encounter (Signed)
I am looking to rule out an abdominal mass given that labs and x-ray have been negative.  Patient also endorses that he feels like a mass type sensation.  Please let me know if this rule out works.

## 2018-03-07 ENCOUNTER — Ambulatory Visit: Payer: BLUE CROSS/BLUE SHIELD | Admitting: Urgent Care

## 2018-03-07 ENCOUNTER — Other Ambulatory Visit: Payer: Self-pay

## 2018-03-07 ENCOUNTER — Encounter: Payer: Self-pay | Admitting: Urgent Care

## 2018-03-07 VITALS — BP 114/68 | HR 73 | Temp 98.2°F | Resp 18 | Ht 65.5 in | Wt 200.8 lb

## 2018-03-07 DIAGNOSIS — R109 Unspecified abdominal pain: Secondary | ICD-10-CM

## 2018-03-07 NOTE — Patient Instructions (Addendum)
Hydrate well with at least 2 liters (1 gallon) of water daily. You can continue taking meloxicam and Flexeril for your belly pain until we can get the CT scan approved.   Abdominal Pain, Adult Abdominal pain can be caused by many things. Often, abdominal pain is not serious and it gets better with no treatment or by being treated at home. However, sometimes abdominal pain is serious. Your health care provider will do a medical history and a physical exam to try to determine the cause of your abdominal pain. Follow these instructions at home:  Take over-the-counter and prescription medicines only as told by your health care provider. Do not take a laxative unless told by your health care provider.  Drink enough fluid to keep your urine clear or pale yellow.  Watch your condition for any changes.  Keep all follow-up visits as told by your health care provider. This is important. Contact a health care provider if:  Your abdominal pain changes or gets worse.  You are not hungry or you lose weight without trying.  You are constipated or have diarrhea for more than 2-3 days.  You have pain when you urinate or have a bowel movement.  Your abdominal pain wakes you up at night.  Your pain gets worse with meals, after eating, or with certain foods.  You are throwing up and cannot keep anything down.  You have a fever. Get help right away if:  Your pain does not go away as soon as your health care provider told you to expect.  You cannot stop throwing up.  Your pain is only in areas of the abdomen, such as the right side or the left lower portion of the abdomen.  You have bloody or black stools, or stools that look like tar.  You have severe pain, cramping, or bloating in your abdomen.  You have signs of dehydration, such as: ? Dark urine, very little urine, or no urine. ? Cracked lips. ? Dry mouth. ? Sunken eyes. ? Sleepiness. ? Weakness. This information is not intended to  replace advice given to you by your health care provider. Make sure you discuss any questions you have with your health care provider. Document Released: 06/07/2005 Document Revised: 03/17/2016 Document Reviewed: 02/09/2016 Elsevier Interactive Patient Education  2018 [employer]     IF you received an x-ray today, you will receive an invoice from [facility]. Please contact [facility] at [phone] with questions or concerns regarding your invoice.   IF you received labwork today, you will receive an invoice from [facility]. Please contact [facility] at [phone] with questions or concerns regarding your invoice.   Our billing staff will not be able to assist you with questions regarding bills from these companies.  You will be contacted with the lab results as soon as they are available. The fastest way to get your results is to activate your My Chart account. Instructions are located on the last page of this paperwork. If you have not heard from us regarding the results in 2 weeks, please contact this office.

## 2018-03-07 NOTE — Progress Notes (Addendum)
   MRN: [id number] DOB: 1955-11-27  Subjective:   Jason Swanson is a 62 y.o. male presenting for follow up on several month history of persistent right-sided abdominal and flank pain.  Still feels masslike sensation within his right abdomen.  At his last office visit on 02/23/2018, patient was advised to start meloxicam and Flexeril while we pursued an abdominal CT.  This was denied.  Work-up has been equivocal.  At this point, I will try to pursue the abdominal CT again for this patient.  Denies fever, nausea, vomiting, bloody stools, dysuria, hematuria, urinary frequency.  He does note very temporary relief with meloxicam and Flexeril.  Jason Swanson has a current medication list which includes the following prescription(s): atorvastatin, cyclobenzaprine, meloxicam, and methimazole. Also has No Known Allergies.  Jason Swanson  has a past medical history of Hyperlipidemia and Hyperthyroidism. Denies past surgical history.  Objective:   Vitals: BP 114/68 (BP Location: Left Arm, Patient Position: Sitting, Cuff Size: Large)   Pulse 73   Temp 98.2 F (36.8 C) (Oral)   Resp 18   Ht 5' 5.5" (1.664 m)   Wt 200 lb 12.8 oz (91.1 kg)   SpO2 96%   BMI 32.91 kg/m   Physical Exam  Constitutional: He is oriented to person, place, and time. He appears well-developed and well-nourished.  HENT:  Mouth/Throat: Oropharynx is clear and moist.  Eyes: Right eye exhibits no discharge. Left eye exhibits no discharge. No scleral icterus.  Cardiovascular: Normal rate, regular rhythm and intact distal pulses. Exam reveals no gallop and no friction rub.  No murmur heard. Pulmonary/Chest: No respiratory distress. He has no wheezes. He has no rales.  Abdominal: Soft. Bowel sounds are normal. He exhibits no distension and no mass. There is tenderness (right sided). There is no rebound and no guarding.  Neurological: He is alert and oriented to person, place, and time.  Skin: Skin is warm and dry.  Psychiatric: He has a normal  mood and affect.   Assessment and Plan :   Right sided abdominal pain  Right flank pain  I will contact his insurance provider again to try and approve the abdominal CT given his persistent abdominal pain.  For now patient will try to use meloxicam, Flexeril and hydrate very well.  Wallis BambergMario Cherlyn Syring, PA-C [facility] [phone] 03/07/2018 4:43 PM

## 2018-03-15 ENCOUNTER — Telehealth: Payer: Self-pay | Admitting: Urgent Care

## 2018-03-15 NOTE — Telephone Encounter (Signed)
-----   Message from Laqueta LindenSara A Seabolt sent at 03/07/2018  5:31 PM EDT ----- Kirby CriglerHey Aubrei Bouchie,  If you call [phone], you will select the peer to peer option to talk with a physician. Member's ID number is [id number] and DOB: Feb 08, 1956. Let me know if you need any additional information. Thanks!  ----- Message ----- From: Beverly SessionsMani, Chrisotpher Rivero, PA-C Sent: 03/07/2018   4:56 PM To: Pcp Referral Pool  Hello Ana Banana,  Can you please send me the information for the review of his abdominal CT?  I am really get a try to get this approved for the patient given that he continues to have belly pain.  I appreciate your help with this.  Thank you!

## 2018-03-15 NOTE — Telephone Encounter (Signed)
I called the insurance provider and was given the run around for 10 minutes before I was redirected to the "courtesy peer review line" to leave a message for a call back. I left the patient's information and a request for a call back to have his CT abdomen/pelvis approved for persistent right sided abdominal pain.

## 2018-03-21 ENCOUNTER — Telehealth: Payer: Self-pay

## 2018-03-21 NOTE — Telephone Encounter (Signed)
Copied from CRM [phone]#[id number]. Topic: General - Other >> Mar 20, 2018  3:31 PM Tamela OddiHarris, Brenda J wrote: Reason for CRM: Alcario DroughtErica, pill nurse, with [employer], called to explain the appeal process for patient.  CB# [phone], Ext. [phone], with confidential VM.  Message [facility] to [facility].

## 2018-03-22 NOTE — Telephone Encounter (Signed)
I spoke with the nurse who discussed the appeal process to obtain a abdominal CT scan for this patient.  She requested that we fax all notes relevant to his case to the following fax number: [phone].  She requested that we make an attention: Appeals and include the patient's name and date of birth.  Please update me on any progress regarding this appeal.

## 2018-03-22 NOTE — Telephone Encounter (Signed)
Called the insurance company again. Had to leave another voice message with my direct line again. Requested a call back to obtain this CT abdomen pelvis for this patient. Awaiting call back.

## 2018-03-22 NOTE — Telephone Encounter (Signed)
Sent pt's notes and labs over to the fax that was provided

## 2018-03-25 ENCOUNTER — Ambulatory Visit: Payer: BLUE CROSS/BLUE SHIELD | Admitting: Endocrinology

## 2018-03-25 ENCOUNTER — Encounter: Payer: Self-pay | Admitting: Endocrinology

## 2018-03-25 VITALS — BP 114/76 | HR 82 | Temp 98.3°F | Ht 66.0 in | Wt 202.6 lb

## 2018-03-25 DIAGNOSIS — E0590 Thyrotoxicosis, unspecified without thyrotoxic crisis or storm: Secondary | ICD-10-CM

## 2018-03-25 LAB — T4, FREE: Free T4: 0.78 ng/dL (ref 0.60–1.60)

## 2018-03-25 LAB — TSH: TSH: 2.54 u[IU]/mL (ref 0.35–4.50)

## 2018-03-25 NOTE — Progress Notes (Signed)
Subjective:    Patient ID: Jason Swanson, male    DOB: [birth year], [age] y.o.   MRN: [id number]  HPI Pt returns for f/u of hyperthyroidism (dx'ed 2018; he chooses tapazole, at least for now; he has never had thyroid imaging).  pt states he feels well in general, except for slight headache.  He seldom misses the tapazole. Past Medical History:  Diagnosis Date  . Hyperlipidemia   . Hyperthyroidism     No past surgical history on file.  Social History   Socioeconomic History  . Marital status: Married    Spouse name: [name]  . Number of children: 6  . Years of education: 816  . Highest education level: Not on file  Occupational History  . Occupation: OPERATOR    Employer: [employer]  Social Needs  . Financial resource strain: Not on file  . Food insecurity:    Worry: Not on file    Inability: Not on file  . Transportation needs:    Medical: Not on file    Non-medical: Not on file  Tobacco Use  . Smoking status: Never Smoker  . Smokeless tobacco: Never Used  Substance and Sexual Activity  . Alcohol use: No  . Drug use: No  . Sexual activity: Yes    Partners: Female  Lifestyle  . Physical activity:    Days per week: Not on file    Minutes per session: Not on file  . Stress: Not on file  Relationships  . Social connections:    Talks on phone: Not on file    Gets together: Not on file    Attends religious service: Not on file    Active member of club or organization: Not on file    Attends meetings of clubs or organizations: Not on file    Relationship status: Not on file  . Intimate partner violence:    Fear of current or ex partner: Not on file    Emotionally abused: Not on file    Physically abused: Not on file    Forced sexual activity: Not on file  Other Topics Concern  . Not on file  Social History Narrative   Lives with his wife and two younger sons.  Older children live in [place].  He is from [place], and came to the [place] in [place].     Current Outpatient Medications on File Prior to Visit  Medication Sig Dispense Refill  . atorvastatin (LIPITOR) 40 MG tablet Take 1 tablet (40 mg total) daily by mouth. 90 tablet 3  . cyclobenzaprine (FLEXERIL) 5 MG tablet Take 1 tablet (5 mg total) by mouth at bedtime. 90 tablet 1  . meloxicam (MOBIC) 7.5 MG tablet Take 1 tablet (7.5 mg total) by mouth daily. 30 tablet 0  . methimazole (TAPAZOLE) 10 MG tablet Take 1 tablet (10 mg total) by mouth 2 (two) times daily. 180 tablet 2   No current facility-administered medications on file prior to visit.     No Known Allergies  Family History  Problem Relation Age of Onset  . Hypertension Father   . Thyroid disease Neg Hx     BP 114/76 (BP Location: Left Arm, Patient Position: Sitting, Cuff Size: Normal)   Pulse 82   Temp 98.3 F (36.8 C) (Oral)   Ht 5\' 6"  (1.676 m)   Wt 202 lb 9.6 oz (91.9 kg)   SpO2 96%   BMI 32.70 kg/m    Review of Systems Denies fever  Objective:   Physical Exam VITAL SIGNS:  See vs page GENERAL: no distress NECK: There is no palpable thyroid enlargement.  No thyroid nodule is palpable.  No palpable lymphadenopathy at the anterior neck.      Assessment & Plan:  Hyperthyroidism: recheck today. Headache: I told pt this was not thyroid-related  Patient Instructions  blood tests are requested for you today.  We'll let you know about the results.   If ever you have fever while taking methimazole, stop it and call us, even if the reason is obvious, because of the risk of a rare side-effect.   Please come back for a follow-up appointment in 4-5 months.

## 2018-03-25 NOTE — Patient Instructions (Signed)
blood tests are requested for you today.  We'll let you know about the results. If ever you have fever while taking methimazole, stop it and call us, even if the reason is obvious, because of the risk of a rare side-effect.   Please come back for a follow-up appointment in 4-5 months.  

## 2018-08-28 ENCOUNTER — Ambulatory Visit: Payer: Self-pay | Admitting: Endocrinology

## 2018-08-30 ENCOUNTER — Ambulatory Visit (INDEPENDENT_AMBULATORY_CARE_PROVIDER_SITE_OTHER): Payer: Self-pay | Admitting: Endocrinology

## 2018-08-30 ENCOUNTER — Encounter: Payer: Self-pay | Admitting: Endocrinology

## 2018-08-30 VITALS — BP 108/80 | HR 72 | Temp 97.4°F | Ht 66.0 in | Wt 211.4 lb

## 2018-08-30 DIAGNOSIS — E0590 Thyrotoxicosis, unspecified without thyrotoxic crisis or storm: Secondary | ICD-10-CM

## 2018-08-30 LAB — TSH: TSH: 1.95 u[IU]/mL (ref 0.35–4.50)

## 2018-08-30 LAB — T4, FREE: Free T4: 0.67 ng/dL (ref 0.60–1.60)

## 2018-08-30 NOTE — Patient Instructions (Addendum)
blood tests are requested for you today.  We'll let you know about the results.   If ever you have fever while taking methimazole, stop it and call us, even if the reason is obvious, because of the risk of a rare side-effect.   Please come back for a follow-up appointment in 6 months.     ??? ???? ?? ????? ?????? ?? ???? ?????????? Methimazole ??????? ?? ???? ???? ??? ?? ?????? ? ??? ???? ????? ??? ???? ? ??? ????? ????? ???? ??? ?????? ???? ?????? ??? ? 6 ??? ??? ????? ??? ??

## 2018-08-30 NOTE — Progress Notes (Signed)
Subjective:    Patient ID: Jason Swanson, male    DOB: 1956-08-12, 62 y.o.   MRN: [id number]  HPI Pt returns for f/u of hyperthyroidism (dx'ed 2018; he chooses tapazole, at least for now; he has never had thyroid imaging; preferred language is Amharic).  pt states he feels well in general.  He never misses the tapazole.   Past Medical History:  Diagnosis Date  . Hyperlipidemia   . Hyperthyroidism     History reviewed. No pertinent surgical history.  Social History   Socioeconomic History  . Marital status: Married    Spouse name: [name]  . Number of children: 6  . Years of education: 316  . Highest education level: Not on file  Occupational History  . Occupation: OPERATOR    Employer: [employer]  Social Needs  . Financial resource strain: Not on file  . Food insecurity:    Worry: Not on file    Inability: Not on file  . Transportation needs:    Medical: Not on file    Non-medical: Not on file  Tobacco Use  . Smoking status: Never Smoker  . Smokeless tobacco: Never Used  Substance and Sexual Activity  . Alcohol use: No  . Drug use: No  . Sexual activity: Yes    Partners: Female  Lifestyle  . Physical activity:    Days per week: Not on file    Minutes per session: Not on file  . Stress: Not on file  Relationships  . Social connections:    Talks on phone: Not on file    Gets together: Not on file    Attends religious service: Not on file    Active member of club or organization: Not on file    Attends meetings of clubs or organizations: Not on file    Relationship status: Not on file  . Intimate partner violence:    Fear of current or ex partner: Not on file    Emotionally abused: Not on file    Physically abused: Not on file    Forced sexual activity: Not on file  Other Topics Concern  . Not on file  Social History Narrative   Lives with his wife and two younger sons.  Older children live in [place].  He is from [place], and came to the  [place] in [place].    Current Outpatient Medications on File Prior to Visit  Medication Sig Dispense Refill  . atorvastatin (LIPITOR) 40 MG tablet Take 1 tablet (40 mg total) daily by mouth. 90 tablet 3  . methimazole (TAPAZOLE) 10 MG tablet Take 1 tablet (10 mg total) by mouth 2 (two) times daily. 180 tablet 2   No current facility-administered medications on file prior to visit.     No Known Allergies  Family History  Problem Relation Age of Onset  . Hypertension Father   . Thyroid disease Neg Hx     BP 108/80 (BP Location: Left Arm, Patient Position: Sitting, Cuff Size: Large)   Pulse 72   Temp (!) 97.4 F (36.3 C) (Oral)   Ht 5\' 6"  (1.676 m)   Wt 211 lb 6.4 oz (95.9 kg)   BMI 34.12 kg/m    Review of Systems Denies fever    Objective:   Physical Exam VITAL SIGNS:  See vs page GENERAL: no distress NECK: There is no palpable thyroid enlargement.  No thyroid nodule is palpable.  No palpable lymphadenopathy at the anterior neck.    Lab  Results  Component Value Date   TSH 1.95 08/30/2018      Assessment & Plan:  Hyperthyroidism: well-controlled   Patient Instructions  blood tests are requested for you today.  We'll let you know about the results.   If ever you have fever while taking methimazole, stop it and call us, even if the reason is obvious, because of the risk of a rare side-effect.   Please come back for a follow-up appointment in 6 months.     ??? ???? ?? ????? ?????? ?? ???? ?????????? Methimazole ??????? ?? ???? ???? ??? ?? ?????? ? ??? ???? ????? ??? ???? ? ??? ????? ????? ???? ??? ?????? ???? ?????? ??? ? 6 ??? ??? ????? ??? ??

## 2018-09-02 ENCOUNTER — Encounter: Payer: Self-pay | Admitting: Endocrinology

## 2018-09-17 ENCOUNTER — Ambulatory Visit: Payer: BLUE CROSS/BLUE SHIELD | Admitting: Emergency Medicine

## 2018-09-17 ENCOUNTER — Encounter: Payer: Self-pay | Admitting: Emergency Medicine

## 2018-09-17 ENCOUNTER — Other Ambulatory Visit: Payer: Self-pay

## 2018-09-17 VITALS — BP 117/74 | HR 67 | Temp 97.7°F | Resp 16 | Ht 65.5 in | Wt 208.6 lb

## 2018-09-17 DIAGNOSIS — K219 Gastro-esophageal reflux disease without esophagitis: Secondary | ICD-10-CM | POA: Diagnosis not present

## 2018-09-17 DIAGNOSIS — Z23 Encounter for immunization: Secondary | ICD-10-CM

## 2018-09-17 DIAGNOSIS — E785 Hyperlipidemia, unspecified: Secondary | ICD-10-CM | POA: Diagnosis not present

## 2018-09-17 MED ORDER — ATORVASTATIN CALCIUM 40 MG PO TABS: 40.0000 mg | ORAL_TABLET | Freq: Every day | ORAL | 3 refills | Status: DC

## 2018-09-17 MED ORDER — ESOMEPRAZOLE MAGNESIUM 40 MG PO CPDR: 40.0000 mg | DELAYED_RELEASE_CAPSULE | Freq: Every day | ORAL | 3 refills | Status: DC

## 2018-09-17 NOTE — Patient Instructions (Addendum)
   If you have lab work done today you will be contacted with your lab results within the next 2 weeks.  If you have not heard from us then please contact us. The fastest way to get your results is to register for My Chart.   IF you received an x-ray today, you will receive an invoice from [facility]. Please contact [facility] at [phone] with questions or concerns regarding your invoice.   IF you received labwork today, you will receive an invoice from [facility]. Please contact [facility] at [phone] with questions or concerns regarding your invoice.   Our billing staff will not be able to assist you with questions regarding bills from these companies.  You will be contacted with the lab results as soon as they are available. The fastest way to get your results is to activate your My Chart account. Instructions are located on the last page of this paperwork. If you have not heard from us regarding the results in 2 weeks, please contact this office.     Food Choices for Gastroesophageal Reflux Disease, Adult When you have gastroesophageal reflux disease (GERD), the foods you eat and your eating habits are very important. Choosing the right foods can help ease your discomfort. Think about working with a nutrition specialist (dietitian) to help you make good choices. What are tips for following this plan?  Meals  Choose healthy foods that are low in fat, such as fruits, vegetables, whole grains, low-fat dairy products, and lean meat, fish, and poultry.  Eat small meals often instead of 3 large meals a day. Eat your meals slowly, and in a place where you are relaxed. Avoid bending over or lying down until 2-3 hours after eating.  Avoid eating meals 2-3 hours before bed.  Avoid drinking a lot of liquid with meals.  Cook foods using methods other than frying. Bake, grill, or broil food instead.  Avoid or limit: ? Chocolate. ? Peppermint or  spearmint. ? Alcohol. ? Pepper. ? Black and decaffeinated coffee. ? Black and decaffeinated tea. ? Bubbly (carbonated) soft drinks. ? Caffeinated energy drinks and soft drinks.  Limit high-fat foods such as: ? Fatty meat or fried foods. ? Whole milk, cream, butter, or ice cream. ? Nuts and nut butters. ? Pastries, donuts, and sweets made with butter or shortening.  Avoid foods that cause symptoms. These foods may be different for everyone. Common foods that cause symptoms include: ? Tomatoes. ? Oranges, lemons, and limes. ? Peppers. ? Spicy food. ? Onions and garlic. ? Vinegar. Lifestyle  Maintain a healthy weight. Ask your doctor what weight is healthy for you. If you need to lose weight, work with your doctor to do so safely.  Exercise for at least 30 minutes for 5 or more days each week, or as told by your doctor.  Wear loose-fitting clothes.  Do not smoke. If you need help quitting, ask your doctor.  Sleep with the head of your bed higher than your feet. Use a wedge under the mattress or blocks under the bed frame to raise the head of the bed. Summary  When you have gastroesophageal reflux disease (GERD), food and lifestyle choices are very important in easing your symptoms.  Eat small meals often instead of 3 large meals a day. Eat your meals slowly, and in a place where you are relaxed.  Limit high-fat foods such as fatty meat or fried foods.  Avoid bending over or lying down until 2-3 hours after   eating.  Avoid peppermint and spearmint, caffeine, alcohol, and chocolate. This information is not intended to replace advice given to you by your health care provider. Make sure you discuss any questions you have with your health care provider. Document Released: 02/27/2012 Document Revised: 10/03/2016 Document Reviewed: 10/03/2016 Elsevier Interactive Patient Education  2019 [employer].

## 2018-09-17 NOTE — Progress Notes (Signed)
Lab Results  Component Value Date   CHOL 156 11/24/2017   HDL 41 11/24/2017   LDLCALC 87 11/24/2017   LDLDIRECT 98 10/12/2013   TRIG 142 11/24/2017   CHOLHDL 3.8 11/24/2017   Jason Swanson 63 y.o.   Chief Complaint  Patient presents with  . Medication Refill    LIPITOR and Atorvastatin  . Abdominal Pain    per patient have pain after eating x 2 weeks off/on    HISTORY OF PRESENT ILLNESS: This is a 63 y.o. male with history of dyslipidemia, needs refill on Lipitor.  Also complaining of chronic epigastric heartburn pain for several years, requesting medication.  Flying to [place] tomorrow and will not be back for at least a month. No other complaints or medical concerns today.  HPI   Prior to Admission medications   Medication Sig Start Date End Date Taking? Authorizing Provider  atorvastatin (LIPITOR) 40 MG tablet Take 1 tablet (40 mg total) daily by mouth. 07/25/17  Yes Romero Belling, MD  methimazole (TAPAZOLE) 10 MG tablet Take 1 tablet (10 mg total) by mouth 2 (two) times daily. 10/25/17  Yes Romero Belling, MD    No Known Allergies  Patient Active Problem List   Diagnosis Date Noted  . Hyperthyroidism 11/06/2016  . Hyperlipidemia 11/18/2011    Past Medical History:  Diagnosis Date  . Hyperlipidemia   . Hyperthyroidism     No past surgical history on file.  Social History   Socioeconomic History  . Marital status: Married    Spouse name: Jason Swanson  . Number of children: 6  . Years of education: 41  . Highest education level: Not on file  Occupational History  . Occupation: OPERATOR    Employer: [employer]  Social Needs  . Financial resource strain: Not on file  . Food insecurity:    Worry: Not on file    Inability: Not on file  . Transportation needs:    Medical: Not on file    Non-medical: Not on file  Tobacco Use  . Smoking status: Never Smoker  . Smokeless tobacco: Never Used  Substance and Sexual Activity  . Alcohol use: No    . Drug use: No  . Sexual activity: Yes    Partners: Female  Lifestyle  . Physical activity:    Days per week: Not on file    Minutes per session: Not on file  . Stress: Not on file  Relationships  . Social connections:    Talks on phone: Not on file    Gets together: Not on file    Attends religious service: Not on file    Active member of club or organization: Not on file    Attends meetings of clubs or organizations: Not on file    Relationship status: Not on file  . Intimate partner violence:    Fear of current or ex partner: Not on file    Emotionally abused: Not on file    Physically abused: Not on file    Forced sexual activity: Not on file  Other Topics Concern  . Not on file  Social History Narrative   Lives with his wife and two younger sons.  Older children live in [place].  He is from [place], and came to the [place] in 2010.    Family History  Problem Relation Age of Onset  . Hypertension Father   . Thyroid disease Neg Hx      Review of Systems  Constitutional: Negative.  Negative for chills and fever.  HENT: Negative.  Negative for congestion, nosebleeds and sore throat.   Eyes: Negative.   Respiratory: Negative.  Negative for cough and shortness of breath.   Cardiovascular: Negative.  Negative for chest pain and palpitations.  Gastrointestinal: Positive for heartburn. Negative for abdominal pain, blood in stool, melena, nausea and vomiting.  Genitourinary: Negative.   Skin: Negative.  Negative for rash.  Neurological: Negative.  Negative for dizziness and headaches.  Endo/Heme/Allergies: Negative.   All other systems reviewed and are negative.   Vitals:   09/17/18 1435  BP: 117/74  Pulse: 67  Resp: 16  Temp: 97.7 F (36.5 C)  SpO2: 96%    Physical Exam Vitals signs reviewed.  Constitutional:      Appearance: He is well-developed.  HENT:     Head: Normocephalic and atraumatic.     Mouth/Throat:     Mouth: Mucous membranes are moist.      Pharynx: Oropharynx is clear.  Eyes:     Extraocular Movements: Extraocular movements intact.     Conjunctiva/sclera: Conjunctivae normal.     Pupils: Pupils are equal, round, and reactive to light.  Neck:     Musculoskeletal: Normal range of motion.  Cardiovascular:     Rate and Rhythm: Normal rate and regular rhythm.     Heart sounds: Normal heart sounds.  Pulmonary:     Effort: Pulmonary effort is normal.     Breath sounds: Normal breath sounds.  Abdominal:     General: Abdomen is flat. Bowel sounds are normal. There is no distension.     Palpations: Abdomen is soft.     Tenderness: There is no abdominal tenderness.  Musculoskeletal: Normal range of motion.  Skin:    General: Skin is warm and dry.     Capillary Refill: Capillary refill takes less than 2 seconds.  Neurological:     General: No focal deficit present.     Mental Status: He is alert and oriented to person, place, and time.  Psychiatric:        Mood and Affect: Mood normal.        Behavior: Behavior normal.    A total of 25 minutes was spent in the room with the patient, greater than 50% of which was in counseling/coordination of care regarding differential diagnosis, treatment, medications, and need for follow-up.   ASSESSMENT & PLAN: Jason Swanson was seen today for medication refill and abdominal pain.  Diagnoses and all orders for this visit:  Gastroesophageal reflux disease without esophagitis -     esomeprazole (NEXIUM) 40 MG capsule; Take 1 capsule (40 mg total) by mouth daily.  Need for prophylactic vaccination and inoculation against influenza -     Flu Vaccine QUAD 36+ mos IM  Dyslipidemia -     atorvastatin (LIPITOR) 40 MG tablet; Take 1 tablet (40 mg total) by mouth daily.    Patient Instructions       If you have lab work done today you will be contacted with your lab results within the next 2 weeks.  If you have not heard from us then please contact us. The fastest way to get your results is  to register for My Chart.   IF you received an x-ray today, you will receive an invoice from [facility]. Please contact [facility] at [phone] with questions or concerns regarding your invoice.   IF you received labwork today, you will receive an invoice from [facility]. Please contact [facility] at [phone] with  questions or concerns regarding your invoice.   Our billing staff will not be able to assist you with questions regarding bills from these companies.  You will be contacted with the lab results as soon as they are available. The fastest way to get your results is to activate your My Chart account. Instructions are located on the last page of this paperwork. If you have not heard from [place] regarding the results in 2 weeks, please contact this office.     Food Choices for Gastroesophageal Reflux Disease, Adult When you have gastroesophageal reflux disease (GERD), the foods you eat and your eating habits are very important. Choosing the right foods can help ease your discomfort. Think about working with a nutrition specialist (dietitian) to help you make good choices. What are tips for following this plan?  Meals  Choose healthy foods that are low in fat, such as fruits, vegetables, whole grains, low-fat dairy products, and lean meat, fish, and poultry.  Eat small meals often instead of 3 large meals a day. Eat your meals slowly, and in a place where you are relaxed. Avoid bending over or lying down until 2-3 hours after eating.  Avoid eating meals 2-3 hours before bed.  Avoid drinking a lot of liquid with meals.  Cook foods using methods other than frying. Bake, grill, or broil food instead.  Avoid or limit: ? Chocolate. ? Peppermint or spearmint. ? Alcohol. ? Pepper. ? Black and decaffeinated coffee. ? Black and decaffeinated tea. ? Bubbly (carbonated) soft drinks. ? Caffeinated energy drinks and soft drinks.  Limit high-fat foods such as: ? Fatty  meat or fried foods. ? Whole milk, cream, butter, or ice cream. ? Nuts and nut butters. ? Pastries, donuts, and sweets made with butter or shortening.  Avoid foods that cause symptoms. These foods may be different for everyone. Common foods that cause symptoms include: ? Tomatoes. ? Oranges, lemons, and limes. ? Peppers. ? Spicy food. ? Onions and garlic. ? Vinegar. Lifestyle  Maintain a healthy weight. Ask your doctor what weight is healthy for you. If you need to lose weight, work with your doctor to do so safely.  Exercise for at least 30 minutes for 5 or more days each week, or as told by your doctor.  Wear loose-fitting clothes.  Do not smoke. If you need help quitting, ask your doctor.  Sleep with the head of your bed higher than your feet. Use a wedge under the mattress or blocks under the bed frame to raise the head of the bed. Summary  When you have gastroesophageal reflux disease (GERD), food and lifestyle choices are very important in easing your symptoms.  Eat small meals often instead of 3 large meals a day. Eat your meals slowly, and in a place where you are relaxed.  Limit high-fat foods such as fatty meat or fried foods.  Avoid bending over or lying down until 2-3 hours after eating.  Avoid peppermint and spearmint, caffeine, alcohol, and chocolate. This information is not intended to replace advice given to you by your health care provider. Make sure you discuss any questions you have with your health care provider. Document Released: 02/27/2012 Document Revised: 10/03/2016 Document Reviewed: 10/03/2016 Elsevier Interactive Patient Education  2019 [employer] Inc.       Edwina Barth, MD [facility]

## 2018-11-26 ENCOUNTER — Other Ambulatory Visit: Payer: Self-pay | Admitting: Endocrinology

## 2018-12-17 ENCOUNTER — Ambulatory Visit: Payer: BLUE CROSS/BLUE SHIELD | Admitting: Emergency Medicine

## 2019-02-22 ENCOUNTER — Other Ambulatory Visit: Payer: Self-pay | Admitting: Endocrinology

## 2019-02-24 NOTE — Telephone Encounter (Signed)
Please refill x 1 F/u is due  

## 2019-03-07 ENCOUNTER — Ambulatory Visit: Payer: Self-pay | Admitting: Endocrinology

## 2019-10-13 ENCOUNTER — Telehealth: Payer: Self-pay | Admitting: Gastroenterology

## 2019-10-13 NOTE — Telephone Encounter (Signed)
Hi Dr. Adela Lank, we have received a referral from patient's PCP for [facility]. Patient was seen at [facility] last November. He needs to transfer his care because they are not in-network with his insurance. Records are in Epic (only 1 ov). Could you please review his records and advise on scheduling? Thank you.

## 2019-10-20 NOTE — Telephone Encounter (Signed)
I'm happy to see him, he can be booked for a routine office visit. Thanks

## 2019-10-20 NOTE — Telephone Encounter (Signed)
Hi Dr. Adela Lank, I am afraid that I did not route this message to you. Could you please advise on scheduling? Thank you.

## 2019-10-21 ENCOUNTER — Encounter: Payer: Self-pay | Admitting: Gastroenterology

## 2019-10-21 NOTE — Telephone Encounter (Signed)
Consult scheduled on 11/18/19 at 9:50am.

## 2019-11-18 ENCOUNTER — Ambulatory Visit (INDEPENDENT_AMBULATORY_CARE_PROVIDER_SITE_OTHER): Payer: 59 | Admitting: Gastroenterology

## 2019-11-18 ENCOUNTER — Encounter: Payer: Self-pay | Admitting: Gastroenterology

## 2019-11-18 ENCOUNTER — Other Ambulatory Visit: Payer: Self-pay

## 2019-11-18 VITALS — BP 134/84 | HR 76 | Temp 98.2°F | Ht 65.5 in | Wt 208.2 lb

## 2019-11-18 DIAGNOSIS — K219 Gastro-esophageal reflux disease without esophagitis: Secondary | ICD-10-CM | POA: Diagnosis not present

## 2019-11-18 MED ORDER — PANTOPRAZOLE SODIUM 40 MG PO TBEC: 40.0000 mg | DELAYED_RELEASE_TABLET | Freq: Two times a day (BID) | ORAL | 0 refills | Status: DC

## 2019-11-18 NOTE — Progress Notes (Signed)
HPI :  64 year old [place] male, history of GERD, hyperlipidemia, referred by Rosita Fire MD for GERD.  History obtained using translator today.  The patient states he has been having reflux symptoms for the past 7 to 8 years or so.  He has waterbrash, pyrosis and regurgitation that have been bothering him.  He also has some bloating and burning discomfort in his upper abdomen after he eats at times.  Sometimes the burning can radiate to his chest.  He denies any dysphagia or odynophagia.  No nausea or vomiting.  Symptoms seem to bother him most about 1 hour after eating.  His weights been stable.  He has tried some antacids in the past.  Most recently has been on omeprazole 20 mg twice daily.  He states this provides some benefit but despite taking it as he is supposed to he has frequent persistent symptoms.  He denies any family history of esophageal or gastric cancer or colon cancer.  He had a colonoscopy in August 2013 showing some benign hyperplastic polyps and hemorrhoids.  He has never had a prior upper endoscopy.  He is concerned about ongoing symptoms despite medical therapy.   Colonoscopy 04/2012 - benign hyperplastic polyps, hemorrhoids   Past Medical History:  Diagnosis Date  . GERD (gastroesophageal reflux disease)   . Hyperlipidemia   . Hyperthyroidism      Past Surgical History:  Procedure Laterality Date  . NO PAST SURGERIES     Family History  Problem Relation Age of Onset  . Hypertension Father   . Thyroid disease Neg Hx   . Colon cancer Neg Hx   . Colon polyps Neg Hx    Social History   Tobacco Use  . Smoking status: Never Smoker  . Smokeless tobacco: Never Used  Substance Use Topics  . Alcohol use: No  . Drug use: No   Current Outpatient Medications  Medication Sig Dispense Refill  . atorvastatin (LIPITOR) 40 MG tablet Take 1 tablet (40 mg total) by mouth daily. 90 tablet 3   No current facility-administered medications for this visit.   No  Known Allergies   Review of Systems: All systems reviewed and negative except where noted in HPI.   Lab Results  Component Value Date   WBC 6.7 11/02/2016   HGB 15.6 11/02/2016   HCT 47.1 11/02/2016   MCV 86 11/02/2016   PLT 198 11/02/2016    Lab Results  Component Value Date   CREATININE 1.13 02/25/2018   BUN 11 02/25/2018   NA 141 02/25/2018   K 3.8 02/25/2018   CL 106 02/25/2018   CO2 23 02/25/2018    Lab Results  Component Value Date   ALT 27 11/24/2017   AST 20 11/24/2017   ALKPHOS 109 11/24/2017   BILITOT 1.1 11/24/2017     Physical Exam: BP 134/84   Pulse 76   Temp 98.2 F (36.8 C)   Ht 5' 5.5" (1.664 m)   Wt 208 lb 3.2 oz (94.4 kg)   BMI 34.12 kg/m  Constitutional: Pleasant,well-developed, male in no acute distress. HEENT: Normocephalic and atraumatic. Conjunctivae are normal. No scleral icterus. Neck supple.  Cardiovascular: Normal rate, regular rhythm.  Pulmonary/chest: Effort normal and breath sounds normal. No wheezing, rales or rhonchi. Abdominal: Soft, nondistended, nontender. There are no masses palpable.  Extremities: no edema Lymphadenopathy: No cervical adenopathy noted. Neurological: Alert and oriented to person place and time. Skin: Skin is warm and dry. No rashes noted. Psychiatric: Normal mood and affect.  Behavior is normal.   ASSESSMENT AND PLAN: 64 year old male here for new patient assessment of the following:  GERD - longstanding symptoms of reflux, he is taking omeprazole 20 mg twice daily, provides very mild improvement and symptoms largely persist.  We discussed reflux in general and management options.  Given his persistent symptoms despite appropriate therapy I am recommending an upper endoscopy to further evaluate.  We will assess for large hiatal hernia, assess for reflux esophagitis and ensure no evidence of Barrett's esophagus.  Following discussion of risks and benefits of upper endoscopy he wanted to proceed.  In the  interim we will stop the omeprazole, and I will give him a trial of higher dose Protonix 40 mg twice a day for a month to see if we can get better control of his symptoms.  He can also use some Gaviscon as needed.  Will await his endoscopy with further recommendations..  He agreed with the plan.  Ileene Patrick, MD [facility]  CC: Avon Gully, MD

## 2019-11-18 NOTE — Patient Instructions (Addendum)
If you are age 63 or older, your body mass index should be between 23-30. Your Body mass index is 34.12 kg/m. If this is out of the aforementioned range listed, please consider follow up with your Primary Care Provider.  If you are age 63 or younger, your body mass index should be between 19-25. Your Body mass index is 34.12 kg/m. If this is out of the aformentioned range listed, please consider follow up with your Primary Care Provider.   You have been scheduled for an endoscopy. Please follow written instructions given to you at your visit today. If you use inhalers (even only as needed), please bring them with you on the day of your procedure. Your physician has requested that you go to www.startemmi.com and enter the access code given to you at your visit today. This web site gives a general overview about your procedure. However, you should still follow specific instructions given to you by our office regarding your preparation for the procedure.  Discontinue omeprazole  We have sent the following medications to your pharmacy for you to pick up at your convenience: Protonix 40mg : Take twice a day   You will be due for a recall colonoscopy in 04-2022. We will send you a reminder in the mail when it gets closer to that time.   Thank you for entrusting me with your care and for choosing [facility], Dr. [facility]

## 2019-11-19 ENCOUNTER — Ambulatory Visit (INDEPENDENT_AMBULATORY_CARE_PROVIDER_SITE_OTHER): Payer: 59

## 2019-11-19 ENCOUNTER — Other Ambulatory Visit: Payer: Self-pay | Admitting: Gastroenterology

## 2019-11-19 DIAGNOSIS — Z1159 Encounter for screening for other viral diseases: Secondary | ICD-10-CM

## 2019-11-20 LAB — SARS CORONAVIRUS 2 (TAT 6-24 HRS): SARS Coronavirus 2: NEGATIVE

## 2019-11-21 ENCOUNTER — Encounter: Payer: Self-pay | Admitting: Gastroenterology

## 2019-11-21 ENCOUNTER — Ambulatory Visit: Payer: 59 | Admitting: Gastroenterology

## 2019-11-21 ENCOUNTER — Other Ambulatory Visit: Payer: Self-pay

## 2019-11-21 VITALS — BP 128/61 | HR 78 | Temp 97.5°F | Ht 65.0 in | Wt 208.0 lb

## 2019-11-21 MED ORDER — SODIUM CHLORIDE 0.9 % IV SOLN: 500.0000 mL | Freq: Once | INTRAVENOUS | Status: DC

## 2019-11-21 NOTE — Progress Notes (Signed)
During admission it was discovered that patient had solid food today around 10:00. After talking to Dr. Adela Lank and CRNA they advised that we need to reschedule. After rescheduling a repeat procedure schedule another nurse went ahead and printed out and went over a "previsit" with patient and interpreter. He is rescheduled for Wednesday.

## 2019-11-21 NOTE — Progress Notes (Signed)
Spoke with patient in pre-op. He had soup at 10AM ish containing vegetables and beans. Unfortunately he can't have solids for at least 8 hours prior to this procedure and we can't proceed with EGD today. He was rescheduled for next week, additional instructions given to the patient regarding specifics of his diet pre-procedure. He agreed with the plan and verbalized understanding.

## 2019-11-21 NOTE — Progress Notes (Signed)
Temp taken by LC VS taken by DT 

## 2019-11-25 ENCOUNTER — Telehealth: Payer: Self-pay

## 2019-11-25 NOTE — Telephone Encounter (Signed)
Left message on answering machine. 

## 2019-11-26 ENCOUNTER — Other Ambulatory Visit: Payer: Self-pay

## 2019-11-26 ENCOUNTER — Ambulatory Visit (AMBULATORY_SURGERY_CENTER): Payer: 59 | Admitting: Gastroenterology

## 2019-11-26 ENCOUNTER — Encounter: Payer: Self-pay | Admitting: Gastroenterology

## 2019-11-26 VITALS — BP 123/77 | HR 70 | Temp 95.9°F | Resp 16 | Ht 65.0 in | Wt 208.0 lb

## 2019-11-26 DIAGNOSIS — K219 Gastro-esophageal reflux disease without esophagitis: Secondary | ICD-10-CM

## 2019-11-26 DIAGNOSIS — K449 Diaphragmatic hernia without obstruction or gangrene: Secondary | ICD-10-CM

## 2019-11-26 MED ORDER — SODIUM CHLORIDE 0.9 % IV SOLN: 500.0000 mL | Freq: Once | INTRAVENOUS | Status: DC

## 2019-11-26 NOTE — Patient Instructions (Signed)
YOU HAD AN ENDOSCOPIC PROCEDURE TODAY AT THE [facility]:   Refer to the procedure report that was given to you for any specific questions about what was found during the examination.  If the procedure report does not answer your questions, please call your gastroenterologist to clarify.  If you requested that your care partner not be given the details of your procedure findings, then the procedure report has been included in a sealed envelope for you to review at your convenience later.  YOU SHOULD EXPECT: Some feelings of bloating in the abdomen. Passage of more gas than usual.  Walking can help get rid of the air that was put into your GI tract during the procedure and reduce the bloating. If you had a lower endoscopy (such as a colonoscopy or flexible sigmoidoscopy) you may notice spotting of blood in your stool or on the toilet paper. If you underwent a bowel prep for your procedure, you may not have a normal bowel movement for a few days.  Please Note:  You might notice some irritation and congestion in your nose or some drainage.  This is from the oxygen used during your procedure.  There is no need for concern and it should clear up in a day or so.  SYMPTOMS TO REPORT IMMEDIATELY:   Following lower endoscopy (colonoscopy or flexible sigmoidoscopy):  Excessive amounts of blood in the stool  Significant tenderness or worsening of abdominal pains  Swelling of the abdomen that is new, acute  Fever of 100F or higher   Following upper endoscopy (EGD)  Vomiting of blood or coffee ground material  New chest pain or pain under the shoulder blades  Painful or persistently difficult swallowing  New shortness of breath  Fever of 100F or higher  Black, tarry-looking stools  For urgent or emergent issues, a gastroenterologist can be reached at any hour by calling [phone]. Do not use MyChart messaging for urgent concerns.    DIET:  We do recommend a small meal at first, but  then you may proceed to your regular diet.  Drink plenty of fluids but you should avoid alcoholic beverages for 24 hours.  ACTIVITY:  You should plan to take it easy for the rest of today and you should NOT DRIVE or use heavy machinery until tomorrow (because of the sedation medicines used during the test).    FOLLOW UP: Our staff will call the number listed on your records 48-72 hours following your procedure to check on you and address any questions or concerns that you may have regarding the information given to you following your procedure. If we do not reach you, we will leave a message.  We will attempt to reach you two times.  During this call, we will ask if you have developed any symptoms of COVID 19. If you develop any symptoms (ie: fever, flu-like symptoms, shortness of breath, cough etc.) before then, please call [phone].  If you test positive for Covid 19 in the 2 weeks post procedure, please call and report this information to us.    If any biopsies were taken you will be contacted by phone or by letter within the next 1-3 weeks.  Please call us at [phone] if you have not heard about the biopsies in 3 weeks.    SIGNATURES/CONFIDENTIALITY: You and/or your care partner have signed paperwork which will be entered into your electronic medical record.  These signatures attest to the fact that that the information above on   your After Visit Summary has been reviewed and is understood.  Full responsibility of the confidentiality of this discharge information lies with you and/or your care-partner. 

## 2019-11-26 NOTE — Progress Notes (Signed)
Pt's states no medical or surgical changes since previsit or office visit. 

## 2019-11-26 NOTE — Progress Notes (Signed)
Report given to PACU, vss 

## 2019-11-26 NOTE — Op Note (Signed)
[facility] Patient Name: Jason Swanson Procedure Date: 11/26/2019 1:31 PM MRN: [id number] Endoscopist: Viviann Spare P. Adela Lank , MD Age: 64 Referring MD:  Date of Birth: 03-27-1956 Gender: Male Account #: [id number] Procedure:                Upper GI endoscopy Indications:              Follow-up of gastro-esophageal reflux disease -                            persistent symptoms on omeprazole 20mg  twice daily,                            transitioned to protonix 40mg  twice daily with                            improvement Medicines:                Monitored Anesthesia Care Procedure:                Pre-Anesthesia Assessment:                           - Prior to the procedure, a History and Physical                            was performed, and patient medications and                            allergies were reviewed. The patient's tolerance of                            previous anesthesia was also reviewed. The risks                            and benefits of the procedure and the sedation                            options and risks were discussed with the patient.                            All questions were answered, and informed consent                            was obtained. Prior Anticoagulants: The patient has                            taken no previous anticoagulant or antiplatelet                            agents. ASA Grade Assessment: II - A patient with                            mild systemic disease. After reviewing the risks  and benefits, the patient was deemed in                            satisfactory condition to undergo the procedure.                           After obtaining informed consent, the endoscope was                            passed under direct vision. Throughout the                            procedure, the patient's blood pressure, pulse, and                            oxygen saturations were monitored  continuously. The                            Endoscope was introduced through the mouth, and                            advanced to the second part of duodenum. The upper                            GI endoscopy was accomplished without difficulty.                            The patient tolerated the procedure well. Scope In: Scope Out: Findings:                 Esophagogastric landmarks were identified: the                            Z-line was found at 39 cm, the gastroesophageal                            junction was found at 39 cm and the upper extent of                            the gastric folds was found at 40 cm from the                            incisors.                           A 1 cm hiatal hernia was present.                           The exam of the esophagus was otherwise normal. No                            Barrett's esophagus                           The entire examined  stomach was normal.                           The duodenal bulb and second portion of the                            duodenum were normal. Complications:            No immediate complications. Estimated blood loss:                            Minimal. Estimated Blood Loss:     Estimated blood loss was minimal. Impression:               - Esophagogastric landmarks identified.                           - 1 cm hiatal hernia.                           - Normal esophagus otherwise - no reflux                            esophagitis or Barrett's                           - Normal stomach.                           - Normal duodenal bulb and second portion of the                            duodenum. Recommendation:           - Patient has a contact number available for                            emergencies. The signs and symptoms of potential                            delayed complications were discussed with the                            patient. Return to normal activities tomorrow.                             Written discharge instructions were provided to the                            patient.                           - Resume previous diet.                           - Continue present medications.                           - Long  term recommend the lowest dose of PPI needed                            to control symptoms. Can continue protonix 40mg                             twice daily if needed, but would recommend trying                            to reduce to 40mg  once daily if controlled on twice                            daily and see how things go. Can follow up with me                            in the office as needed for this issue Remo Lipps P. Charletha Dalpe, MD 11/26/2019 1:48:54 PM This report has been signed electronically.

## 2019-11-28 ENCOUNTER — Telehealth: Payer: Self-pay

## 2019-11-28 NOTE — Telephone Encounter (Signed)
No answer

## 2019-11-28 NOTE — Telephone Encounter (Signed)
Left message on 2nd follow up call. 

## 2019-12-31 ENCOUNTER — Ambulatory Visit: Payer: 59 | Attending: Internal Medicine

## 2019-12-31 DIAGNOSIS — Z23 Encounter for immunization: Secondary | ICD-10-CM

## 2019-12-31 NOTE — Progress Notes (Signed)
   Covid-19 Vaccination Clinic  Name:  Jason Swanson    MRN: [id number] DOB: 1955/11/20  12/31/2019  Mr. [name] was observed post Covid-19 immunization for 15 minutes without incident. He was provided with Vaccine Information Sheet and instruction to access the V-Safe system.   Mr. [name] was instructed to call 911 with any severe reactions post vaccine: Marland Kitchen Difficulty breathing  . Swelling of face and throat  . A fast heartbeat  . A bad rash all over body  . Dizziness and weakness   Immunizations Administered    Name Date Dose VIS Date Route   Pfizer COVID-19 Vaccine 12/31/2019  9:33 AM 0.3 mL 11/05/2018 Intramuscular   Manufacturer: [employer], [employer]   Lot: [id number]   NDC: [address]-6

## 2020-01-24 ENCOUNTER — Encounter (HOSPITAL_COMMUNITY): Payer: Self-pay | Admitting: *Deleted

## 2020-01-24 ENCOUNTER — Emergency Department (HOSPITAL_COMMUNITY): Payer: 59

## 2020-01-24 ENCOUNTER — Emergency Department (HOSPITAL_COMMUNITY)
Admission: EM | Admit: 2020-01-24 | Discharge: 2020-01-24 | Disposition: A | Discharge status: Home / Self Care | Payer: 59 | Attending: Emergency Medicine | Admitting: Emergency Medicine

## 2020-01-24 ENCOUNTER — Other Ambulatory Visit: Payer: Self-pay

## 2020-01-24 DIAGNOSIS — Z79899 Other long term (current) drug therapy: Secondary | ICD-10-CM | POA: Diagnosis not present

## 2020-01-24 DIAGNOSIS — M25511 Pain in right shoulder: Secondary | ICD-10-CM | POA: Diagnosis not present

## 2020-01-24 LAB — COMPREHENSIVE METABOLIC PANEL
ALT: 35 U/L (ref 0–44)
AST: 26 U/L (ref 15–41)
Albumin: 4.2 g/dL (ref 3.5–5.0)
Alkaline Phosphatase: 63 U/L (ref 38–126)
Anion gap: 11 (ref 5–15)
BUN: 10 mg/dL (ref 8–23)
CO2: 24 mmol/L (ref 22–32)
Calcium: 9.3 mg/dL (ref 8.9–10.3)
Chloride: 104 mmol/L (ref 98–111)
Creatinine, Ser: 1.06 mg/dL (ref 0.61–1.24)
GFR calc Af Amer: >60 mL/min (ref >60)
GFR calc non Af Amer: >60 mL/min (ref >60)
Glucose, Bld: 99 mg/dL (ref 70–99)
Potassium: 4.1 mmol/L (ref 3.5–5.1)
Sodium: 139 mmol/L (ref 135–145)
Total Bilirubin: 0.8 mg/dL (ref 0.3–1.2)
Total Protein: 7.4 g/dL (ref 6.5–8.1)

## 2020-01-24 LAB — CBC WITH DIFFERENTIAL/PLATELET
Abs Immature Granulocytes: 0.04 10*3/uL (ref 0.00–0.07)
Basophils Absolute: 0.1 10*3/uL (ref 0.0–0.1)
Basophils Relative: 1 %
Eosinophils Absolute: 0.1 10*3/uL (ref 0.0–0.5)
Eosinophils Relative: 2 %
HCT: 53.5 % — ABNORMAL HIGH (ref 39.0–52.0)
Hemoglobin: 17.4 g/dL — ABNORMAL HIGH (ref 13.0–17.0)
Immature Granulocytes: 1 %
Lymphocytes Relative: 36 %
Lymphs Abs: 2.4 10*3/uL (ref 0.7–4.0)
MCH: 29.9 pg (ref 26.0–34.0)
MCHC: 32.5 g/dL (ref 30.0–36.0)
MCV: 91.9 fL (ref 80.0–100.0)
Monocytes Absolute: 0.5 10*3/uL (ref 0.1–1.0)
Monocytes Relative: 7 %
Neutro Abs: 3.7 10*3/uL (ref 1.7–7.7)
Neutrophils Relative %: 53 %
Platelets: 190 10*3/uL (ref 150–400)
RBC: 5.82 MIL/uL — ABNORMAL HIGH (ref 4.22–5.81)
RDW: 12.8 % (ref 11.5–15.5)
WBC: 6.9 10*3/uL (ref 4.0–10.5)
nRBC: 0 % (ref 0.0–0.2)

## 2020-01-24 LAB — D-DIMER, QUANTITATIVE: D-Dimer, Quant: <0.27 ug/mL-FEU (ref 0.00–0.50)

## 2020-01-24 MED ORDER — KETOROLAC TROMETHAMINE 15 MG/ML IJ SOLN
15.0000 mg | Freq: Once | INTRAMUSCULAR | Status: AC
  Administered 2020-01-24: 15 mg via INTRAMUSCULAR
  Filled 2020-01-24: qty 1

## 2020-01-24 MED ORDER — NAPROXEN 375 MG PO TABS: 375.0000 mg | ORAL_TABLET | Freq: Two times a day (BID) | ORAL | 0 refills | Status: AC

## 2020-01-24 MED ORDER — CYCLOBENZAPRINE HCL 10 MG PO TABS
10.0000 mg | ORAL_TABLET | Freq: Three times a day (TID) | ORAL | 0 refills | Status: AC
Start: 2020-01-24 — End: 2020-01-31

## 2020-01-24 NOTE — ED Notes (Signed)
Pt d/c home per MD order. Discharge summary reviewed, pt verbalizes understanding. Off unit via WC. No s/s of acute distress noted, voicing no complaints at discharge,

## 2020-01-24 NOTE — Discharge Instructions (Addendum)
Your x-ray today was within normal limits.  You were provided a prescription for anti-inflammatories, please take 1 tablet twice a day for the next 7 days. I have also provided a prescription for muscle relaxers, please take this medication as needed for muscle spasms.  Please be aware this medication can make you drowsy, do not drink alcohol or drive while taking this medication.  The number to Orthopedics is attached to your chart, please schedule an appointment in order to obtain further evaluation of your right shoulder pain.

## 2020-01-24 NOTE — ED Provider Notes (Signed)
[facility] EMERGENCY DEPARTMENT Provider Note   CSN: [id number] Arrival date & time: 01/24/20  0049     History Chief Complaint  Patient presents with  . Shoulder Pain    Jason Swanson is a 64 y.o. male.  64 year old male with a past medical history of hyperlipidemia, hypothyroid, chronic GERD presents to the ED with complaints of right shoulder pain for the past 2 months.  Described as a constant pulling/aching sensation to the back of his right shoulder, exacerbated with lying on his right side while at night, along with movement in adduction manner.  Has not tried medication for improvement in his symptoms.  Does report pain is somewhat improved with lying on his back.  No trauma, fevers, chest pain, shortness of breath or tingling.   Shoulder Pain Location:  Shoulder Shoulder location:  R shoulder Injury: no   Pain details:    Quality:  Dull   Radiates to:  Does not radiate   Duration:  2 months   Timing:  Constant   Progression:  Unchanged Handedness:  Right-handed Dislocation: no   Prior injury to area:  No Relieved by:  Nothing Worsened by:  Movement Ineffective treatments:  None tried Associated symptoms: no back pain, no decreased range of motion, no fever, no neck pain, no numbness and no tingling   Risk factors: no concern for non-accidental trauma, no known bone disorder, no frequent fractures and no recent illness        Past Medical History:  Diagnosis Date  . GERD (gastroesophageal reflux disease)   . Hyperlipidemia   . Hyperthyroidism     Patient Active Problem List   Diagnosis Date Noted  . Hyperthyroidism 11/06/2016  . Hyperlipidemia 11/18/2011    Past Surgical History:  Procedure Laterality Date  . NO PAST SURGERIES         Family History  Problem Relation Age of Onset  . Hypertension Father   . Thyroid disease Neg Hx   . Colon cancer Neg Hx   . Colon polyps Neg Hx     Social History   Tobacco Use  .  Smoking status: Never Smoker  . Smokeless tobacco: Never Used  Substance Use Topics  . Alcohol use: No  . Drug use: No    Home Medications Prior to Admission medications   Medication Sig Start Date End Date Taking? Authorizing Provider  atorvastatin (LIPITOR) 40 MG tablet Take 1 tablet (40 mg total) by mouth daily. 09/17/18  Yes Sagardia, Eilleen Kempf, MD  omeprazole (PRILOSEC OTC) 20 MG tablet Take 20 mg by mouth daily as needed (for heartburn).   Yes [provider]  cyclobenzaprine (FLEXERIL) 10 MG tablet Take 1 tablet (10 mg total) by mouth 3 (three) times daily for 7 days. 01/24/20 01/31/20  Claude Manges, PA-C  naproxen (NAPROSYN) 375 MG tablet Take 1 tablet (375 mg total) by mouth 2 (two) times daily with a meal for 7 days. 01/24/20 01/31/20  Claude Manges, PA-C  pantoprazole (PROTONIX) 40 MG tablet Take 1 tablet (40 mg total) by mouth 2 (two) times daily before a meal. Patient not taking: Reported on 01/24/2020 11/18/19   Armbruster, Willaim Rayas, MD    Allergies    Patient has no known allergies.  Review of Systems   Review of Systems  Constitutional: Negative for fever.  HENT: Negative for sore throat.   Respiratory: Negative for shortness of breath.   Cardiovascular: Negative for chest pain.  Gastrointestinal: Negative for abdominal pain.  Genitourinary: Negative for flank pain.  Musculoskeletal: Positive for arthralgias and myalgias. Negative for back pain, joint swelling and neck pain.  Neurological: Negative for light-headedness and headaches.  All other systems reviewed and are negative.   Physical Exam Updated Vital Signs BP 126/87   Pulse 68   Temp 97.6 F (36.4 C)   Resp 15   Wt 70.3 kg   SpO2 97%   BMI 25.79 kg/m   Physical Exam Vitals and nursing note reviewed.  Constitutional:      Appearance: Normal appearance. He is not ill-appearing or toxic-appearing.  HENT:     Head: Normocephalic and atraumatic.     Nose: Nose normal.  Eyes:     Pupils: Pupils  are equal, round, and reactive to light.  Cardiovascular:     Rate and Rhythm: Normal rate.  Pulmonary:     Effort: Pulmonary effort is normal.     Breath sounds: No wheezing.  Abdominal:     General: Abdomen is flat.     Tenderness: There is no abdominal tenderness. There is no right CVA tenderness or left CVA tenderness.  Musculoskeletal:        General: Tenderness present.     Right shoulder: Tenderness present. No swelling, deformity, effusion, laceration, bony tenderness or crepitus. Normal range of motion. Normal strength. Normal pulse.     Cervical back: Normal range of motion and neck supple. No rigidity or tenderness.     Comments: Pulses present, capillary refills intact.  Full range of motion at the right wrist, right elbow, right shoulder.  Pain does occur with abduction of the right shoulder.  Strength intact.  No changes in the skin, no erythema or deformity noted.  Skin:    General: Skin is warm and dry.  Neurological:     Mental Status: He is alert and oriented to person, place, and time.     ED Results / Procedures / Treatments   Labs (all labs ordered are listed, but only abnormal results are displayed) Labs Reviewed  CBC WITH DIFFERENTIAL/PLATELET - Abnormal; Notable for the following components:      Result Value   RBC 5.82 (*)    Hemoglobin 17.4 (*)    HCT 53.5 (*)    All other components within normal limits  COMPREHENSIVE METABOLIC PANEL  D-DIMER, QUANTITATIVE (NOT AT [facility])    EKG EKG Interpretation  Date/Time:  Saturday Jan 24 2020 11:28:49 EDT Ventricular Rate:  73 PR Interval:    QRS Duration: 97 QT Interval:  373 QTC Calculation: 411 R Axis:   10 Text Interpretation: Sinus rhythm Abnormal R-wave progression, early transition Nonspecific T abnormalities, lateral leads No previous ECGs available Confirmed by Frederick Peers [phone]) on 01/24/2020 2:14:35 PM   Radiology DG Chest 2 View  Result Date: 01/24/2020 CLINICAL DATA:  Shortness of breath  for several days EXAM: CHEST - 2 VIEW COMPARISON:  10/21/2015 FINDINGS: Cardiac shadow is within normal limits. The lungs are well aerated bilaterally without focal infiltrate or sizable effusion. Mild degenerative changes of the thoracic spine are noted. IMPRESSION: No active cardiopulmonary disease. Electronically Signed   By: Alcide Clever M.D.   On: 01/24/2020 12:30   DG Shoulder Right  Result Date: 01/24/2020 CLINICAL DATA:  Right shoulder pain for 2 months. Pain and numbness radiating down the right arm. No injury. EXAM: RIGHT SHOULDER - 2+ VIEW COMPARISON:  None. FINDINGS: There is no evidence of fracture or dislocation. There is no evidence of arthropathy or other focal bone  abnormality. Soft tissues are unremarkable. IMPRESSION: Negative. Electronically Signed   By: Lajean Manes M.D.   On: 01/24/2020 09:38    Procedures Procedures (including critical care time)  Medications Ordered in ED Medications  ketorolac (TORADOL) 15 MG/ML injection 15 mg (15 mg Intramuscular Given 01/24/20 1133)    ED Course  I have reviewed the triage vital signs and the nursing notes.  Pertinent labs & imaging results that were available during my care of the patient were reviewed by me and considered in my medical decision making (see chart for details).    MDM Rules/Calculators/A&P   Patient with a past medical history of hyperlipidemia presents to the ED with complaints of right shoulder pain for the past month.  Does report this pain is exacerbated with movement, exacerbated with lying on his right shoulder.  Has not tried a medication for improvement in his symptoms.  During evaluation he is overall well-appearing, denies any chest pain, shortness of breath,no prior cardiac history. No epigastric pain non radiating, low suspicion for ACS.  He is able to fully range right shoulder although does have pain with abduction of the shoulder, strength is equal to bilateral upper extremities.  Xray of his right  shoulder showed no fracture or dislocation.   Results were discussed at length with patient, he was provided with IV Toradol to help with symptoms.  11:11 AM upon reassessment, patient reports that he is concerned as his family had COVID-19 infection in January, states that he felt like he coughed for a whole month, was having somewhat shortness of breath.  Now pain has began on his shoulder.  Not have any prior history of blood clots.  Currently not on any anticoagulation.  Labs were added and reviewed by me, CMP without any electrolyte normality, creatinine was within normal limits.  LFTs are unremarkable.  CBC without any leukocytosis, hemoglobin slightly hemoconcentrated, does not have any prior history of clots.  No recent hospitalization, immobilization, hemoptysis, no tachycardia or hypoxia on today's exam, D-dimer is negative.  Chest x-ray without any acute findings.  EKG without any signs of arrhythmia, ST changes, or infarct. Lower suspicion for ACS, no troponin was obtained as he denies any chest pain.    Portions of this note were generated with Lobby[employer]. Dictation errors may occur despite best attempts at proofreading.  Final Clinical Impression(s) / ED Diagnoses Final diagnoses:  Acute pain of right shoulder    Rx / DC Orders ED Discharge Orders         Ordered    naproxen (NAPROSYN) 375 MG tablet  2 times daily with meals     01/24/20 1444    cyclobenzaprine (FLEXERIL) 10 MG tablet  3 times daily     01/24/20 1444           Janeece Fitting, PA-C 01/24/20 1500    Little, Wenda Overland, MD 01/24/20 1547

## 2020-01-24 NOTE — ED Notes (Signed)
Pt transported to xray 

## 2020-01-24 NOTE — ED Triage Notes (Signed)
The pt is c/o rt shoulder pain for one month  Worse tonight

## 2020-02-14 ENCOUNTER — Other Ambulatory Visit: Payer: Self-pay | Admitting: Emergency Medicine

## 2020-02-14 DIAGNOSIS — E785 Hyperlipidemia, unspecified: Secondary | ICD-10-CM

## 2020-02-14 NOTE — Telephone Encounter (Signed)
Requested medication (s) are due for refill today: yes  Requested medication (s) are on the active medication list: yes  Last refill:  09/17/18  Future visit scheduled: no  Notes to clinic:  overdue for appt- using interpreter [phone]- called pt and LM on VM to call office Monday to schedule F/U appt.   Requested Prescriptions  Pending Prescriptions Disp Refills   atorvastatin (LIPITOR) 40 MG tablet [Pharmacy Med Name: Atorvastatin Calcium 40 MG Oral Tablet] 90 tablet 0    Sig: Take 1 tablet by mouth once daily      Cardiovascular:  Antilipid - Statins Failed - 02/14/2020  9:30 AM      Failed - Total Cholesterol in normal range and within 360 days    Cholesterol, Total  Date Value Ref Range Status  11/24/2017 156 100 - 199 mg/dL Final          Failed - LDL in normal range and within 360 days    LDL Calculated  Date Value Ref Range Status  11/24/2017 87 0 - 99 mg/dL Final   Direct LDL  Date Value Ref Range Status  10/12/2013 98 mg/dL Final    Comment:    ATP III Classification (LDL):       < 100        mg/dL         Optimal      756 - 129     mg/dL         Near or Above Optimal      130 - 159     mg/dL         Borderline High      160 - 189     mg/dL         High       > 433        mg/dL         Very High            Failed - HDL in normal range and within 360 days    HDL  Date Value Ref Range Status  11/24/2017 41 >39 mg/dL Final          Failed - Triglycerides in normal range and within 360 days    Triglycerides  Date Value Ref Range Status  11/24/2017 142 0 - 149 mg/dL Final          Failed - Valid encounter within last 12 months    Recent Outpatient Visits           1 year ago Gastroesophageal reflux disease without esophagitis   Primary Care at [facility], Eilleen Kempf, MD   1 year ago Right sided abdominal pain   Primary Care at [facility], [place], [place]   1 year ago Right sided abdominal pain   Primary Care at [facility], [place], [place]   1 year  ago Right sided abdominal pain   Primary Care at [facility], [place], [place]   2 years ago Right flank pain   Primary Care at [facility], [place], [place]              Passed - Patient is not pregnant

## 2020-03-05 ENCOUNTER — Other Ambulatory Visit: Payer: Self-pay | Admitting: Emergency Medicine

## 2020-03-05 DIAGNOSIS — E785 Hyperlipidemia, unspecified: Secondary | ICD-10-CM

## 2021-02-01 ENCOUNTER — Encounter: Payer: Self-pay | Admitting: *Deleted

## 2021-02-01 ENCOUNTER — Telehealth: Payer: Self-pay | Admitting: *Deleted

## 2021-02-01 NOTE — Telephone Encounter (Signed)
RN notified by HR that pt reported covid like sx. Supervisor advised he stay out of work today until contacted by clinic.  Spoke with pt by phone. He reports a dry cough and joint pain in bilateral legs that started Sat afternoon. Sx improving already. Denies rhinorrhea, sinus pain/pressure, HA, sore throat. He did a home test on Day 1 that was negative. Advised him to repeat test today and complete 5 day quarantine.   Day 0 5/21 Day 5 5/26 Test 5/24 RTW 5/27 with strict mask use thru 5/31

## 2021-02-01 NOTE — Telephone Encounter (Signed)
Reviewed RN Rolly Salter note agreed with plan of care.  Per HR records patient unvaccinated for covid.  Per epic received pfizer covid dose 12/31/19.  Will need to verify vaccination status with patient.

## 2021-02-03 NOTE — Telephone Encounter (Signed)
Patient returned call stated still has runny nose taking coricidin OTC.  Has cough, body aches/back pain using tylenol, ginger tea with honey.  Covid test negative.  Discussed may take tylenol 1000mg  by mouth every 6 hours as needed for pain.  Consider trial epsom salt soaks in bathtub.  May start flonase nasal spray generic 1 spray each nostril BID prn rhinitis and continue coricidin.    Discussed with patient when returning to work he must wear mask for 5 days at work since still having symptoms.  Return to work onsite 02/04/21 as long as no fever/vomiting or diarrhea 24 hours prior.  Patient A&Ox3 spoke full sentences without difficulty no cough nasal sniffing or throat clearing audible during 8 minute telephone call.  Patient verbalized understanding information/instructions, agreed with plan of care and had no further questions at this time.   HR notified patient cleared to return onsite with strict mask wear through 02/08/21.

## 2021-02-04 NOTE — Telephone Encounter (Signed)
Called pt to confirm RTW as planned today. No answer. LVMRCB.  No answer from supervisor when calling to confirm RTW.

## 2021-02-05 NOTE — Telephone Encounter (Signed)
Reviewed RN Rolly Salter note unable to reach patient or supervisor to verify if RTW as expected.

## 2021-02-08 NOTE — Telephone Encounter (Signed)
Reviewed RN Rolly Salter note patient no answer left message today

## 2021-02-08 NOTE — Telephone Encounter (Signed)
Called pt to check in on Day 10 from sx. No answer. LVMRCB.

## 2021-02-10 NOTE — Telephone Encounter (Signed)
Pt returned call this morning. Reports he did RTW 5/27 as expected and followed strict mask use thru 5/31. Denies any current sx. Feels well. Closing encounter.

## 2021-02-10 NOTE — Telephone Encounter (Signed)
Reviewed RN Rolly Salter note patient feeling well and no concerns.

## 2021-03-15 ENCOUNTER — Other Ambulatory Visit (HOSPITAL_COMMUNITY): Payer: Self-pay | Admitting: Gerontology

## 2021-03-15 ENCOUNTER — Other Ambulatory Visit: Payer: Self-pay

## 2021-03-15 ENCOUNTER — Ambulatory Visit (HOSPITAL_COMMUNITY)
Admission: RE | Admit: 2021-03-15 | Discharge: 2021-03-15 | Disposition: A | Discharge status: Home / Self Care | Payer: 59 | Source: Ambulatory Visit | Attending: Gerontology | Admitting: Gerontology

## 2021-03-15 DIAGNOSIS — M545 Low back pain, unspecified: Secondary | ICD-10-CM | POA: Diagnosis present

## 2021-05-25 ENCOUNTER — Encounter (HOSPITAL_BASED_OUTPATIENT_CLINIC_OR_DEPARTMENT_OTHER): Payer: Self-pay

## 2021-05-25 ENCOUNTER — Emergency Department (HOSPITAL_BASED_OUTPATIENT_CLINIC_OR_DEPARTMENT_OTHER)
Admission: EM | Admit: 2021-05-25 | Discharge: 2021-05-25 | Disposition: A | Discharge status: Home / Self Care | Payer: 59 | Attending: Emergency Medicine | Admitting: Emergency Medicine

## 2021-05-25 ENCOUNTER — Other Ambulatory Visit: Payer: Self-pay

## 2021-05-25 ENCOUNTER — Emergency Department (HOSPITAL_BASED_OUTPATIENT_CLINIC_OR_DEPARTMENT_OTHER): Payer: 59

## 2021-05-25 ENCOUNTER — Emergency Department (HOSPITAL_BASED_OUTPATIENT_CLINIC_OR_DEPARTMENT_OTHER): Payer: 59 | Admitting: Radiology

## 2021-05-25 DIAGNOSIS — R42 Dizziness and giddiness: Secondary | ICD-10-CM | POA: Diagnosis present

## 2021-05-25 DIAGNOSIS — H538 Other visual disturbances: Secondary | ICD-10-CM | POA: Insufficient documentation

## 2021-05-25 DIAGNOSIS — R519 Headache, unspecified: Secondary | ICD-10-CM | POA: Insufficient documentation

## 2021-05-25 DIAGNOSIS — R1084 Generalized abdominal pain: Secondary | ICD-10-CM

## 2021-05-25 DIAGNOSIS — K219 Gastro-esophageal reflux disease without esophagitis: Secondary | ICD-10-CM | POA: Insufficient documentation

## 2021-05-25 DIAGNOSIS — R0602 Shortness of breath: Secondary | ICD-10-CM | POA: Insufficient documentation

## 2021-05-25 DIAGNOSIS — R1013 Epigastric pain: Secondary | ICD-10-CM | POA: Insufficient documentation

## 2021-05-25 LAB — CBC
HCT: 49.3 % (ref 39.0–52.0)
Hemoglobin: 16.6 g/dL (ref 13.0–17.0)
MCH: 29.9 pg (ref 26.0–34.0)
MCHC: 33.7 g/dL (ref 30.0–36.0)
MCV: 88.7 fL (ref 80.0–100.0)
Platelets: 177 10*3/uL (ref 150–400)
RBC: 5.56 MIL/uL (ref 4.22–5.81)
RDW: 12.5 % (ref 11.5–15.5)
WBC: 6.1 10*3/uL (ref 4.0–10.5)
nRBC: 0 % (ref 0.0–0.2)

## 2021-05-25 LAB — COMPREHENSIVE METABOLIC PANEL
ALT: 35 U/L (ref 0–44)
AST: 22 U/L (ref 15–41)
Albumin: 4.6 g/dL (ref 3.5–5.0)
Alkaline Phosphatase: 64 U/L (ref 38–126)
Anion gap: 9 (ref 5–15)
BUN: 15 mg/dL (ref 8–23)
CO2: 26 mmol/L (ref 22–32)
Calcium: 9.6 mg/dL (ref 8.9–10.3)
Chloride: 104 mmol/L (ref 98–111)
Creatinine, Ser: 1.15 mg/dL (ref 0.61–1.24)
GFR, Estimated: >60 mL/min (ref >60)
Glucose, Bld: 105 mg/dL — ABNORMAL HIGH (ref 70–99)
Potassium: 3.9 mmol/L (ref 3.5–5.1)
Sodium: 139 mmol/L (ref 135–145)
Total Bilirubin: 1 mg/dL (ref 0.3–1.2)
Total Protein: 7.6 g/dL (ref 6.5–8.1)

## 2021-05-25 LAB — TROPONIN I (HIGH SENSITIVITY): Troponin I (High Sensitivity): 2 ng/L (ref <18)

## 2021-05-25 LAB — LIPASE, BLOOD: Lipase: 26 U/L (ref 11–51)

## 2021-05-25 MED ORDER — IOHEXOL 350 MG/ML SOLN
80.0000 mL | Freq: Once | INTRAVENOUS | Status: AC | PRN
  Administered 2021-05-25: 80 mL via INTRAVENOUS

## 2021-05-25 MED ORDER — SODIUM CHLORIDE 0.9 % IV BOLUS
1000.0000 mL | Freq: Once | INTRAVENOUS | Status: AC
  Administered 2021-05-25: 1000 mL via INTRAVENOUS

## 2021-05-25 MED ORDER — PANTOPRAZOLE SODIUM 40 MG PO TBEC: 40.0000 mg | DELAYED_RELEASE_TABLET | Freq: Two times a day (BID) | ORAL | 0 refills | Status: DC

## 2021-05-25 MED ORDER — PROCHLORPERAZINE EDISYLATE 10 MG/2ML IJ SOLN
10.0000 mg | Freq: Once | INTRAMUSCULAR | Status: AC
  Administered 2021-05-25: 10 mg via INTRAVENOUS
  Filled 2021-05-25: qty 2

## 2021-05-25 MED ORDER — DIPHENHYDRAMINE HCL 50 MG/ML IJ SOLN
25.0000 mg | Freq: Once | INTRAMUSCULAR | Status: AC
  Administered 2021-05-25: 25 mg via INTRAVENOUS
  Filled 2021-05-25: qty 1

## 2021-05-25 NOTE — ED Notes (Signed)
Patient Handoff/Care Handoff given to [employer], Charity fundraiser.

## 2021-05-25 NOTE — ED Notes (Signed)
EDP at Bedside 

## 2021-05-25 NOTE — ED Notes (Signed)
Patient transported to Radiology at this Time. 

## 2021-05-25 NOTE — ED Provider Notes (Signed)
  Physical Exam  BP (!) 141/79 (BP Location: Right Arm)   Pulse 63   Temp 97.6 F (36.4 C) (Oral)   Resp 16   Ht 5\' 5"  (1.651 m)   Wt 70.3 kg   SpO2 99%   BMI 25.79 kg/m   Physical Exam  ED Course/Procedures     Procedures  MDM  65yo male who speaks Amharic with history of hyperlipidemia, hyperthyroidism, GERD with EGD 11/2019 showing hiatal hernia without abnormalities who presents with concern for upper abdominal pain, nausea, lightheadedness.  Please see Dr. 12/2019 note for prior history, physical and care.   CT head without acute abnormalities.  Chest x-ray without pneumonia, pneumothorax, cardiopericardial silhouette within normal limits for size.  Troponin negative and low suspicion this represents acute coronary syndrome.  CBC shows no leukocytosis or anemia.  CMP shows no acute findings.. CT abdomen pelvis without acute findings.  Describes pulsatile type tinnitus constantly for last year on my history, in setting of this description and headache I have also added on a CTA head/neck.  No sign of vascular abnormality on CTA.  Suspect likely gastritis/GERD and possible vagal reaction to pain causing lightheadedness.  Recommend PCP follow up. Patient discharged in stable condition with understanding of reasons to return.             Acey Lav, MD 05/25/21 [id number]

## 2021-05-25 NOTE — ED Triage Notes (Signed)
Patient here POV from Home with ABD Pain and Nausea.  Patient states he has been having symptoms for approximately 1 year. Moderate Nausea with No Emesis. No Diarrhea.   NAD Noted during Triage. Patient also complaining of Headache. GCS 15. A&Ox4.

## 2021-05-25 NOTE — ED Provider Notes (Signed)
[facility]ERGENCY [facility] Emergency Department Provider Note MRN:  [id number]  Arrival date & time: 05/25/21     Chief Complaint   Abdominal Pain and Nausea   History of Present Illness   Jason Swanson is a 65 y.o. year-old male with a history of GERD presenting to the ED with chief complaint of abdominal pain.  History obtained using Amharic audio interpreter.  Patient is here with multiple complaints.  Abdominal pain diffuse but worse in the epigastrium for over a year but continues to worsen.  Constant pressure squeezing pain.  Denies any chest pain.  Also endorsing occasional dizziness described as a lightheadedness associated with blurred vision, headache, shortness of breath.  The symptoms occurring more frequently recently but have been present for weeks.  Seems to happen more often when standing up or when bending down.  Denies any fever, no cough, no dysuria, no hematuria, no numbness or weakness to the arms or legs.     Review of Systems  A complete 10 system review of systems was obtained and all systems are negative except as noted in the HPI and PMH.   Patient's Health History    Past Medical History:  Diagnosis Date   GERD (gastroesophageal reflux disease)    Hyperlipidemia    Hyperthyroidism     Past Surgical History:  Procedure Laterality Date   NO PAST SURGERIES      Family History  Problem Relation Age of Onset   Hypertension Father    Thyroid disease Neg Hx    Colon cancer Neg Hx    Colon polyps Neg Hx     Social History   Socioeconomic History   Marital status: Married    Spouse name: [name]   Number of children: 6   Years of education: 16   Highest education level: Not on file  Occupational History   Occupation: OPERATOR    Employer: [employer]  Tobacco Use   Smoking status: Never   Smokeless tobacco: Never  Vaping Use   Vaping Use: Never used  Substance and Sexual Activity   Alcohol use: No   Drug use: No    Sexual activity: Yes    Partners: Female  Other Topics Concern   Not on file  Social History Narrative   Lives with his wife and two younger sons.  Older children live in [place].  He is from [place], and came to the [place] in 2010.   Social Determinants of Health   Financial Resource Strain: Not on file  Food Insecurity: Not on file  Transportation Needs: Not on file  Physical Activity: Not on file  Stress: Not on file  Social Connections: Not on file  Intimate Partner Violence: Not on file     Physical Exam   Vitals:   05/25/21 0608  BP: (!) 141/79  Pulse: 63  Resp: 16  Temp: 97.6 F (36.4 C)  SpO2: 99%    CONSTITUTIONAL: Well-appearing, NAD NEURO:  Alert and oriented x 3, normal and symmetric strength and sensation, normal coordination, normal speech EYES:  eyes equal and reactive ENT/NECK:  no LAD, no JVD CARDIO: Regular rate, well-perfused, normal S1 and S2 PULM:  CTAB no wheezing or rhonchi GI/GU:  normal bowel sounds, non-distended, non-tender MSK/SPINE:  No gross deformities, no edema SKIN:  no rash, atraumatic PSYCH:  Appropriate speech and behavior  *Additional and/or pertinent findings included in MDM below  Diagnostic and Interventional Summary    EKG Interpretation  Date/Time:  Wednesday  May 25 2021 06:43:23 EDT Ventricular Rate:  63 PR Interval:  176 QRS Duration: 99 QT Interval:  402 QTC Calculation: 412 R Axis:   12 Text Interpretation: Sinus rhythm Abnormal R-wave progression, early transition Borderline repolarization abnormality No significant change was found Confirmed by Kennis Carina [phone]) on 05/25/2021 6:46:07 AM       Labs Reviewed  CBC  COMPREHENSIVE METABOLIC PANEL  LIPASE, BLOOD  TROPONIN I (HIGH SENSITIVITY)    CT ABDOMEN PELVIS W CONTRAST    (Results Pending)  CT HEAD WO CONTRAST ( )    (Results Pending)  DG Chest 2 View    (Results Pending)    Medications - No data to display   Procedures  /  Critical  Care Procedures  ED Course and Medical Decision Making  I have reviewed the triage vital signs, the nursing notes, and pertinent available records from the EMR.  Listed above are laboratory and imaging tests that I personally ordered, reviewed, and interpreted and then considered in my medical decision making (see below for details).  Worsening chronic symptoms, abdominal pain likely related to GERD, also considering pancreatitis, underlying malignancy given the duration of symptoms.  Will CT to further evaluate dizziness is described as a lightheadedness and seems most likely related to orthostatic hypotension.  However given the associated headaches, blurred vision will obtain screening CT head.  Overall patient is sitting comfortably with normal vital signs, benign abdomen, if negative work-up anticipating discharge with PCP follow-up.  Patient is understanding of this plan.  Signed out to oncoming provider at shift change.       Elmer Sow. Pilar Plate, MD [facility] Emergency Medicine [facility] [email or web address]  Final Clinical Impressions(s) / ED Diagnoses     ICD-10-CM   1. Lightheadedness  R42     2. SOB (shortness of breath)  R06.02 DG Chest 2 View    DG Chest 2 View    3. Generalized abdominal pain  R10.84     4. Nonintractable headache, unspecified chronicity pattern, unspecified headache type  R51.9       ED Discharge Orders     None        Discharge Instructions Discussed with and Provided to Patient:   Discharge Instructions   None       Sabas Sous, MD 05/25/21 [phone]

## 2021-05-30 ENCOUNTER — Ambulatory Visit: Payer: Self-pay | Admitting: *Deleted

## 2021-05-30 ENCOUNTER — Other Ambulatory Visit: Payer: Self-pay

## 2021-05-30 VITALS — BP 126/86 | HR 76 | Ht 66.0 in | Wt 198.0 lb

## 2021-05-30 DIAGNOSIS — R7303 Prediabetes: Secondary | ICD-10-CM

## 2021-05-30 DIAGNOSIS — Z0000 Encounter for general adult medical examination without abnormal findings: Secondary | ICD-10-CM

## 2021-05-30 DIAGNOSIS — R7989 Other specified abnormal findings of blood chemistry: Secondary | ICD-10-CM

## 2021-05-30 NOTE — Progress Notes (Signed)
[employer] insurance premium discount evaluation: Labs Drawn. Replacements ROI form signed. Tobacco Free Attestation form signed.  Forms placed in paper chart.  

## 2021-05-31 ENCOUNTER — Encounter: Payer: Self-pay | Admitting: Registered Nurse

## 2021-05-31 LAB — CMP12+LP+TP+TSH+6AC+PSA+CBC…
ALT: 29 IU/L (ref 0–44)
AST: 21 IU/L (ref 0–40)
Albumin/Globulin Ratio: 1.9 (ref 1.2–2.2)
Albumin: 4.9 g/dL — ABNORMAL HIGH (ref 3.8–4.8)
Alkaline Phosphatase: 84 IU/L (ref 44–121)
BUN/Creatinine Ratio: 11 (ref 10–24)
BUN: 15 mg/dL (ref 8–27)
Basophils Absolute: 0 10*3/uL (ref 0.0–0.2)
Basos: 1 %
Bilirubin Total: 0.7 mg/dL (ref 0.0–1.2)
Calcium: 9.3 mg/dL (ref 8.6–10.2)
Chloride: 103 mmol/L (ref 96–106)
Chol/HDL Ratio: 4.9 ratio (ref 0.0–5.0)
Cholesterol, Total: 212 mg/dL — ABNORMAL HIGH (ref 100–199)
Creatinine, Ser: 1.31 mg/dL — ABNORMAL HIGH (ref 0.76–1.27)
EOS (ABSOLUTE): 0.1 10*3/uL (ref 0.0–0.4)
Eos: 1 %
Estimated CHD Risk: 1 times avg. (ref 0.0–1.0)
Free Thyroxine Index: 1.4 (ref 1.2–4.9)
GGT: 25 IU/L (ref 0–65)
Globulin, Total: 2.6 g/dL (ref 1.5–4.5)
Glucose: 93 mg/dL (ref 65–99)
HDL: 43 mg/dL (ref >39)
Hematocrit: 51.4 % — ABNORMAL HIGH (ref 37.5–51.0)
Hemoglobin: 17.3 g/dL (ref 13.0–17.7)
Immature Grans (Abs): 0 10*3/uL (ref 0.0–0.1)
Immature Granulocytes: 0 %
Iron: 81 ug/dL (ref 38–169)
LDH: 191 IU/L (ref 121–224)
LDL Chol Calc (NIH): 135 mg/dL — ABNORMAL HIGH (ref 0–99)
Lymphocytes Absolute: 2.3 10*3/uL (ref 0.7–3.1)
Lymphs: 35 %
MCH: 30.1 pg (ref 26.6–33.0)
MCHC: 33.7 g/dL (ref 31.5–35.7)
MCV: 90 fL (ref 79–97)
Monocytes Absolute: 0.4 10*3/uL (ref 0.1–0.9)
Monocytes: 7 %
Neutrophils Absolute: 3.6 10*3/uL (ref 1.4–7.0)
Neutrophils: 56 %
Phosphorus: 2.7 mg/dL — ABNORMAL LOW (ref 2.8–4.1)
Platelets: 195 10*3/uL (ref 150–450)
Potassium: 4.1 mmol/L (ref 3.5–5.2)
Prostate Specific Ag, Serum: 0.6 ng/mL (ref 0.0–4.0)
RBC: 5.74 x10E6/uL (ref 4.14–5.80)
RDW: 12.7 % (ref 11.6–15.4)
Sodium: 140 mmol/L (ref 134–144)
T3 Uptake Ratio: 26 % (ref 24–39)
T4, Total: 5.4 ug/dL (ref 4.5–12.0)
TSH: 1.05 u[IU]/mL (ref 0.450–4.500)
Total Protein: 7.5 g/dL (ref 6.0–8.5)
Triglycerides: 187 mg/dL — ABNORMAL HIGH (ref 0–149)
Uric Acid: 6.9 mg/dL (ref 3.8–8.4)
VLDL Cholesterol Cal: 34 mg/dL (ref 5–40)
WBC: 6.4 10*3/uL (ref 3.4–10.8)
eGFR: 61 mL/min/{1.73_m2} (ref >59)

## 2021-05-31 LAB — HGB A1C W/O EAG: Hgb A1c MFr Bld: 6 % — ABNORMAL HIGH (ref 4.8–5.6)

## 2021-05-31 NOTE — Addendum Note (Signed)
Addended by: Albina Billet A on: 05/31/2021 09:26 AM   Modules accepted: Orders

## 2021-05-31 NOTE — Progress Notes (Signed)
Results reviewed with pt in clinic as per NP's notes. Pt reports urine is not pale but not dark yellow either. Does not drink much water. Encouraged to increase to at least 60oz/day. Denies any NSAID/OTC/supplement/herbal use. Diet and exercise recommendations discussed as well. Routine f/u with pcp. Hard copy of labs and handouts provided to pt. Results routed to pcp per pt request. No further questions/concerns.

## 2021-05-31 NOTE — Progress Notes (Signed)
Noted patient encouraged to increase water intake.  Please give patient [employer] information on how to schedule with dietitian and schedule 3 month follow up labs with patient nonfasting also.

## 2021-06-16 NOTE — Progress Notes (Signed)
Noted patient following up with PCM

## 2021-11-03 ENCOUNTER — Other Ambulatory Visit: Payer: Self-pay

## 2021-11-03 ENCOUNTER — Other Ambulatory Visit: Payer: Self-pay | Admitting: *Deleted

## 2021-11-03 DIAGNOSIS — R7303 Prediabetes: Secondary | ICD-10-CM

## 2021-11-03 DIAGNOSIS — R7989 Other specified abnormal findings of blood chemistry: Secondary | ICD-10-CM

## 2021-11-03 NOTE — Progress Notes (Signed)
[employer] insurance premium discount evaluation: Labs Drawn. (Recheck from Sept 2022 labs) Replacements ROI form signed. Tobacco Free Attestation form signed.  Forms placed in paper chart.

## 2021-11-04 LAB — BASIC METABOLIC PANEL
BUN/Creatinine Ratio: 13 (ref 10–24)
BUN: 14 mg/dL (ref 8–27)
CO2: 22 mmol/L (ref 20–29)
Calcium: 9.3 mg/dL (ref 8.6–10.2)
Chloride: 101 mmol/L (ref 96–106)
Creatinine, Ser: 1.05 mg/dL (ref 0.76–1.27)
Glucose: 80 mg/dL (ref 70–99)
Potassium: 4 mmol/L (ref 3.5–5.2)
Sodium: 142 mmol/L (ref 134–144)
eGFR: 79 mL/min/{1.73_m2} (ref >59)

## 2021-11-04 LAB — HGB A1C W/O EAG: Hgb A1c MFr Bld: 5.8 % — ABNORMAL HIGH (ref 4.8–5.6)

## 2021-11-05 ENCOUNTER — Encounter: Payer: Self-pay | Admitting: Registered Nurse

## 2021-11-05 NOTE — Addendum Note (Signed)
Addended by: Gerarda Fraction A on: 11/05/2021 05:05 PM   Modules accepted: Orders

## 2022-03-16 ENCOUNTER — Ambulatory Visit: Payer: Self-pay | Admitting: Registered Nurse

## 2022-03-16 ENCOUNTER — Encounter: Payer: Self-pay | Admitting: Registered Nurse

## 2022-03-16 VITALS — BP 131/80 | HR 68 | Temp 97.7°F | Resp 16

## 2022-03-16 DIAGNOSIS — H9313 Tinnitus, bilateral: Secondary | ICD-10-CM

## 2022-03-16 DIAGNOSIS — H6983 Other specified disorders of Eustachian tube, bilateral: Secondary | ICD-10-CM

## 2022-03-16 MED ORDER — SALINE SPRAY 0.65 % NA SOLN: 2.0000 | NASAL | 0 refills | Status: DC

## 2022-03-16 MED ORDER — LORATADINE 10 MG PO TABS: 10.0000 mg | ORAL_TABLET | Freq: Every day | ORAL | 0 refills | Status: DC

## 2022-03-16 NOTE — Patient Instructions (Signed)
Tinnitus ?Tinnitus refers to hearing a sound when there is no actual source for that sound. This is often described as ringing in the ears. However, people with this condition may hear a variety of noises, in one ear or in both ears. ?The sounds of tinnitus can be soft, loud, or somewhere in between. Tinnitus can last for a few seconds or can be constant for days. It may go away without treatment and come back at various times. When tinnitus is constant or happens often, it can lead to other problems, such as trouble sleeping and trouble concentrating. ?Almost everyone experiences tinnitus at some point. Tinnitus is not the same as hearing loss. Tinnitus that is long-lasting (chronic) or comes back often (recurs) may require medical attention. ?What are the causes? ?The cause of tinnitus is often not known. In some cases, it can result from: ?Exposure to loud noises from machinery, music, or other sources. ?An object (foreign body) stuck in the ear. ?Earwax buildup. ?Drinking alcohol or caffeine. ?Taking certain medicines. ?Age-related hearing loss. ?It may also be caused by medical conditions such as: ?Ear or sinus infections. ?Heart diseases or high blood pressure. ?Allergies. ?M?ni?re's disease. ?Thyroid problems. ?Tumors. ?A weak, bulging blood vessel (aneurysm) near the ear. ?What increases the risk? ?The following factors may make you more likely to develop this condition: ?Exposure to loud noises. ?Age. Tinnitus is more likely in older individuals. ?Using alcohol or tobacco. ?What are the signs or symptoms? ?The main symptom of tinnitus is hearing a sound when there is no source for that sound. It may sound like: ?Buzzing. ?Sizzling. ?Ringing. ?Blowing air. ?Hissing. ?Whistling. ?Other sounds may include: ?Roaring. ?Running water. ?A musical note. ?Tapping. ?Humming. ?Symptoms may affect only one ear (unilateral) or both ears (bilateral). ?How is this diagnosed? ?Tinnitus is diagnosed based on your symptoms,  your medical history, and a physical exam. Your health care provider may do a thorough hearing test (audiologic exam) if your tinnitus: ?Is unilateral. ?Causes hearing difficulties. ?Lasts 6 months or longer. ?You may work with a health care provider who specializes in hearing disorders (audiologist). You may be asked questions about your symptoms and how they affect your daily life. You may have other tests done, such as: ?CT scan. ?MRI. ?An imaging test of how blood flows through your blood vessels (angiogram). ?How is this treated? ?Treating an underlying medical condition can sometimes make tinnitus go away. If your tinnitus continues, other treatments may include: ?Therapy and counseling to help you manage the stress of living with tinnitus. ?Sound generators to mask the tinnitus. These include: ?Tabletop sound machines that play relaxing sounds to help you fall asleep. ?Wearable devices that fit in your ear and play sounds or music. ?Acoustic neural stimulation. This involves using headphones to listen to music that contains an auditory signal. Over time, listening to this signal may change some pathways in your brain and make you less sensitive to tinnitus. This treatment is used for very severe cases when no other treatment is working. ?Using hearing aids or cochlear implants if your tinnitus is related to hearing loss. Hearing aids are worn in the outer ear. Cochlear implants are surgically placed in the inner ear. ?Follow these instructions at home: ?Managing symptoms ? ?  ? ?When possible, avoid being in loud places and being exposed to loud sounds. ?Wear hearing protection, such as earplugs, when you are exposed to loud noises. ?Use a white noise machine, a humidifier, or other devices to mask the sound of tinnitus. ?  Practice techniques for reducing stress, such as meditation, yoga, or deep breathing. Work with your health care provider if you need help with managing stress. Sleep with your head  slightly raised. This may reduce the impact of tinnitus. General instructions Do not use stimulants, such as nicotine, alcohol, or caffeine. Talk with your health care provider about other stimulants to avoid. Stimulants are substances that can make you feel alert and attentive by increasing certain activities in the body (such as heart rate and blood pressure). These substances may make tinnitus worse. Take over-the-counter and prescription medicines only as told by your health care provider. Try to get plenty of sleep each night. Keep all follow-up visits. This is important. Contact a health care provider if: Your tinnitus continues for 3 weeks or longer without stopping. You develop sudden hearing loss. Your symptoms get worse or do not get better with home care. You feel you are not able to manage the stress of living with tinnitus. Get help right away if: You develop tinnitus after a head injury. You have tinnitus along with any of the following: Dizziness. Nausea and vomiting. Loss of balance. Sudden, severe headache. Vision changes. Facial weakness or weakness of arms or legs. These symptoms may represent a serious problem that is an emergency. Do not wait to see if the symptoms will go away. Get medical help right away. Call your local emergency services (911 in the [place] Do not drive yourself to the hospital. Summary Tinnitus refers to hearing a sound when there is no actual source for that sound. This is often described as ringing in the ears. Symptoms may affect only one ear (unilateral) or both ears (bilateral). Use a white noise machine, a humidifier, or other devices to mask the sound of tinnitus. Do not use stimulants, such as nicotine, alcohol, or caffeine. These substances may make tinnitus worse. This information is not intended to replace advice given to you by your health care provider. Make sure you discuss any questions you have with your health care  provider. Document Revised: 08/02/2020 Document Reviewed: 08/02/2020 Elsevier Patient Education  2023 [employer] Eustachian Tube Dysfunction  Eustachian tube dysfunction refers to a condition in which a blockage develops in the narrow passage that connects the middle ear to the back of the nose (eustachian tube). The eustachian tube regulates air pressure in the middle ear by letting air move between the ear and nose. It also helps to drain fluid from the middle ear space. Eustachian tube dysfunction can affect one or both ears. When the eustachian tube does not function properly, air pressure, fluid, or both can build up in the middle ear. What are the causes? This condition occurs when the eustachian tube becomes blocked or cannot open normally. Common causes of this condition include: Ear infections. Colds and other infections that affect the nose, mouth, and throat (upper respiratory tract). Allergies. Irritation from cigarette smoke. Irritation from stomach acid coming up into the esophagus (gastroesophageal reflux). The esophagus is the part of the body that moves food from the mouth to the stomach. Sudden changes in air pressure, such as from descending in an airplane or scuba diving. Abnormal growths in the nose or throat, such as: Growths that line the nose (nasal polyps). Abnormal growth of cells (tumors). Enlarged tissue at the back of the throat (adenoids). What increases the risk? You are more likely to develop this condition if: You smoke. You are overweight. You are a child who has: Certain birth defects of  the mouth, such as cleft palate. Large tonsils or adenoids. What are the signs or symptoms? Common symptoms of this condition include: A feeling of fullness in the ear. Ear pain. Clicking or popping noises in the ear. Ringing in the ear (tinnitus). Hearing loss. Loss of balance. Dizziness. Symptoms may get worse when the air pressure around you changes, such  as when you travel to an area of high elevation, fly on an airplane, or go scuba diving. How is this diagnosed? This condition may be diagnosed based on: Your symptoms. A physical exam of your ears, nose, and throat. Tests, such as those that measure: The movement of your eardrum. Your hearing (audiometry). How is this treated? Treatment depends on the cause and severity of your condition. In mild cases, you may relieve your symptoms by moving air into your ears. This is called "popping the ears." In more severe cases, or if you have symptoms of fluid in your ears, treatment may include: Medicines to relieve congestion (decongestants). Medicines that treat allergies (antihistamines). Nasal sprays or ear drops that contain medicines that reduce swelling (steroids). A procedure to drain the fluid in your eardrum. In this procedure, a small tube may be placed in the eardrum to: Drain the fluid. Restore the air in the middle ear space. A procedure to insert a balloon device through the nose to inflate the opening of the eustachian tube (balloon dilation). Follow these instructions at home: Lifestyle Do not do any of the following until your health care provider approves: Travel to high altitudes. Fly in airplanes. Work in a Estate agent or room. Scuba dive. Do not use any products that contain nicotine or tobacco. These products include cigarettes, chewing tobacco, and vaping devices, such as e-cigarettes. If you need help quitting, ask your health care provider. Keep your ears dry. Wear fitted earplugs during showering and bathing. Dry your ears completely after. General instructions Take over-the-counter and prescription medicines only as told by your health care provider. Use techniques to help pop your ears as recommended by your health care provider. These may include: Chewing gum. Yawning. Frequent, forceful swallowing. Closing your mouth, holding your nose closed, and gently  blowing as if you are trying to blow air out of your nose. Keep all follow-up visits. This is important. Contact a health care provider if: Your symptoms do not go away after treatment. Your symptoms come back after treatment. You are unable to pop your ears. You have: A fever. Pain in your ear. Pain in your head or neck. Fluid draining from your ear. Your hearing suddenly changes. You become very dizzy. You lose your balance. Get help right away if: You have a sudden, severe increase in any of your symptoms. Summary Eustachian tube dysfunction refers to a condition in which a blockage develops in the eustachian tube. It can be caused by ear infections, allergies, inhaled irritants, or abnormal growths in the nose or throat. Symptoms may include ear pain or fullness, hearing loss, or ringing in the ears. Mild cases are treated with techniques to unblock the ears, such as yawning or chewing gum. More severe cases are treated with medicines or procedures. This information is not intended to replace advice given to you by your health care provider. Make sure you discuss any questions you have with your health care provider. Document Revised: 11/08/2020 Document Reviewed: 11/08/2020 Elsevier Patient Education  2023 [employer].

## 2022-03-16 NOTE — Progress Notes (Signed)
Subjective:    Patient ID: Jason Swanson, male    DOB: Jan 12, 1956, 66 y.o.   MRN: [id number]  66y/o african [place] male here for ear ringing and hearing water in his ears.  During cold weather notices symptoms worsens and denied rhinorrhea at this time.  Does not take any nasal sprays or antihistamines on regular basis.  Denied fever/chills/ear discharge/ ear bleeding/trauma to head/dizziness/n/v/d.      Review of Systems  Constitutional:  Negative for activity change, appetite change, chills, diaphoresis, fatigue and fever.  HENT:  Positive for tinnitus. Negative for ear discharge, ear pain, facial swelling, hearing loss, postnasal drip, rhinorrhea, sinus pressure, sinus pain, sneezing, sore throat, trouble swallowing and voice change.   Eyes:  Negative for photophobia, pain, discharge, redness and visual disturbance.  Respiratory:  Negative for cough, choking, shortness of breath, wheezing and stridor.   Cardiovascular:  Negative for chest pain.  Gastrointestinal:  Negative for diarrhea, nausea and vomiting.  Endocrine: Negative for cold intolerance and heat intolerance.  Genitourinary:  Negative for difficulty urinating.  Musculoskeletal:  Negative for gait problem, neck pain and neck stiffness.  Skin:  Negative for rash.  Allergic/Immunologic: Negative for environmental allergies and food allergies.  Neurological:  Negative for dizziness, tremors, seizures, syncope, facial asymmetry, speech difficulty, weakness, light-headedness, numbness and headaches.  Hematological:  Negative for adenopathy. Does not bruise/bleed easily.  Psychiatric/Behavioral:  Negative for agitation, confusion and sleep disturbance.        Objective:   Physical Exam Vitals and nursing note reviewed.  Constitutional:      General: He is awake. He is not in acute distress.    Appearance: Normal appearance. He is well-developed, well-groomed and normal weight. He is not ill-appearing, toxic-appearing  or diaphoretic.  HENT:     Head: Normocephalic and atraumatic.     Jaw: There is normal jaw occlusion. No trismus.     Salivary Glands: Right salivary gland is not diffusely enlarged or tender. Left salivary gland is not diffusely enlarged or tender.     Right Ear: Hearing, ear canal and external ear normal. No decreased hearing noted. No laceration, drainage, swelling or tenderness. A middle ear effusion is present. There is no impacted cerumen. No foreign body. No mastoid tenderness. No PE tube. No hemotympanum. Tympanic membrane is not injected, scarred, perforated, erythematous or retracted.     Left Ear: Hearing, ear canal and external ear normal. No decreased hearing noted. No laceration, drainage, swelling or tenderness. A middle ear effusion is present. There is no impacted cerumen. No foreign body. No mastoid tenderness. No PE tube. No hemotympanum. Tympanic membrane is not injected, scarred, perforated, erythematous or retracted.     Nose: Rhinorrhea present. No nasal deformity, septal deviation, signs of injury, laceration, nasal tenderness, mucosal edema or congestion.     Right Turbinates: Not enlarged or swollen.     Left Turbinates: Not enlarged or swollen.     Right Sinus: No maxillary sinus tenderness or frontal sinus tenderness.     Left Sinus: No maxillary sinus tenderness or frontal sinus tenderness.     Mouth/Throat:     Lips: Pink. No lesions.     Mouth: Mucous membranes are moist. Mucous membranes are not pale, not dry and not cyanotic. No lacerations, oral lesions or angioedema.     Dentition: No dental abscesses or gum lesions.     Tongue: No lesions. Tongue does not deviate from midline.     Palate: No mass and lesions.  Pharynx: Uvula midline. Pharyngeal swelling and posterior oropharyngeal erythema present. No oropharyngeal exudate or uvula swelling.     Tonsils: No tonsillar exudate or tonsillar abscesses. 0 on the right. 0 on the left.     Comments: Cobblestoning  posterior pharynx; bilateral TMs air fluid level clear; bilateral lower eyelids nonpitting edema 1-2+/4; bilateral allergic shiners;  Eyes:     General: Lids are normal. Vision grossly intact. Gaze aligned appropriately. Allergic shiner present. No scleral icterus.       Right eye: No foreign body, discharge or hordeolum.        Left eye: No foreign body, discharge or hordeolum.     Extraocular Movements: Extraocular movements intact.     Right eye: Normal extraocular motion and no nystagmus.     Left eye: Normal extraocular motion and no nystagmus.     Conjunctiva/sclera: Conjunctivae normal.     Right eye: Right conjunctiva is not injected. No chemosis, exudate or hemorrhage.    Left eye: Left conjunctiva is not injected. No chemosis, exudate or hemorrhage.    Pupils: Pupils are equal, round, and reactive to light. Pupils are equal.     Right eye: Pupil is round and reactive.     Left eye: Pupil is round and reactive.  Neck:     Thyroid: No thyroid mass or thyromegaly.     Trachea: Trachea and phonation normal. No tracheal tenderness or tracheal deviation.  Cardiovascular:     Rate and Rhythm: Normal rate and regular rhythm.     Pulses: Normal pulses.          Radial pulses are 2+ on the right side and 2+ on the left side.  Pulmonary:     Effort: Pulmonary effort is normal. No respiratory distress.     Breath sounds: Normal breath sounds and air entry. No stridor. No decreased breath sounds, wheezing, rhonchi or rales.     Comments: Spoke full sentences without difficulty; no cough observed in exam room Abdominal:     General: There is no distension.     Palpations: Abdomen is soft.  Musculoskeletal:        General: No tenderness or signs of injury. Normal range of motion.     Cervical back: Normal range of motion and neck supple. No swelling, edema, deformity, erythema, signs of trauma, lacerations, rigidity, torticollis, tenderness or crepitus. No pain with movement, spinous process  tenderness or muscular tenderness. Normal range of motion.     Thoracic back: No swelling, edema, deformity, signs of trauma or lacerations. Normal range of motion.     Right lower leg: No edema.     Left lower leg: No edema.  Lymphadenopathy:     Head:     Right side of head: No submental, submandibular, tonsillar, preauricular, posterior auricular or occipital adenopathy.     Left side of head: No submental, submandibular, tonsillar, preauricular, posterior auricular or occipital adenopathy.     Cervical: No cervical adenopathy.     Right cervical: No superficial, deep or posterior cervical adenopathy.    Left cervical: No superficial, deep or posterior cervical adenopathy.  Skin:    General: Skin is warm and dry.     Capillary Refill: Capillary refill takes less than 2 seconds.     Coloration: Skin is not ashen, cyanotic, jaundiced, mottled, pale or sallow.     Findings: No abrasion, abscess, acne, bruising, burn, ecchymosis, erythema, signs of injury, laceration, lesion, petechiae, rash or wound.     Nails:  There is no clubbing.  Neurological:     General: No focal deficit present.     Mental Status: He is alert and oriented to person, place, and time. Mental status is at baseline.     GCS: GCS eye subscore is 4. GCS verbal subscore is 5. GCS motor subscore is 6.     Cranial Nerves: Cranial nerves 2-12 are intact. No cranial nerve deficit, dysarthria or facial asymmetry.     Sensory: Sensation is intact. No sensory deficit.     Motor: Motor function is intact. No weakness, tremor, atrophy, abnormal muscle tone or seizure activity.     Coordination: Coordination is intact. Coordination normal.     Gait: Gait is intact. Gait normal.     Comments: Gait sure and steady in clinic; in/out of chair without difficulty; bilateral hand grasp equal 5/5  Psychiatric:        Attention and Perception: Attention and perception normal.        Mood and Affect: Mood and affect normal.        Speech:  Speech normal.        Behavior: Behavior normal. Behavior is cooperative.        Thought Content: Thought content normal.        Cognition and Memory: Cognition and memory normal.        Judgment: Judgment normal.           Assessment & Plan:   A-eustachian tube dysfunction bilateral, tinnitus  P- No evidence of invasive bacterial infection, non toxic and well hydrated.  I do not see where any further testing or imaging is necessary at this time.   I will suggest supportive care, rest, good hygiene and encourage the patient to take adequate fluids.  The patient is to return to clinic or EMERGENCY ROOM if symptoms worsen or change significantly e.g. ear pain, fever, purulent discharge from ears or bleeding.  Exitcare handout on tinnitus and eustachian tube dysfunction.  Start claritin 10mg  po daily x 5 days and re-evaluation on Tuesday with NP.  Consider ENT referral for tinnitus--effusion otic on exam today most likely cause.  Discussed with patient post nasal drip irritates throat/causes swelling blocks eustachian tubes from draining and fluid fills up middle ear.  Bacteria/viruses can grow in fluid and with moving head tube compressed and increases pressure in tube/ear worsening pain.  Studies show will take 30 days for fluid to resolve after post nasal drip controlled with nasal steroid/antihistamine. Antibiotics and steroids do not speed up fluid removal.  Patient verbalized agreement and understanding of treatment plan and had no further questions at this time.

## 2022-03-21 ENCOUNTER — Telehealth: Payer: Self-pay | Admitting: Registered Nurse

## 2022-03-21 ENCOUNTER — Encounter: Payer: Self-pay | Admitting: Registered Nurse

## 2022-03-21 DIAGNOSIS — E785 Hyperlipidemia, unspecified: Secondary | ICD-10-CM

## 2022-03-21 MED ORDER — MECLIZINE HCL 25 MG PO TABS: 25.0000 mg | ORAL_TABLET | Freq: Four times a day (QID) | ORAL | 0 refills | Status: AC | PRN

## 2022-03-21 NOTE — Telephone Encounter (Signed)
Claritin didn't help tinnitis or hearing water in ears denied new symptoms will trial meclzine 34m po qhs and prn TID during day (QID prn) #30 RF0 electronic Rx sent to his pharmacy of choice.  Discussed can cause drowsiness and take first dose when at home and no driving for couple hours after taking it until he knows how he responds.  Follow up Thursday re-evaluation.  Patient requested refill of atorvastatin from [facility] today.  Epic reviewed.  Last Rx from Dr MAgustina Caroli6/25/21.   Noted in chart states insurance changed 10/14/19 and needs to find new PCM.  [facility] and obtain new Rx for atorvastatin.  Bridge refill atorvastatin 466mpo daily #90 RF0 today.   Last labs 05/30/21   Latest Reference Range & Units 05/30/21 09:00  Sodium 134 - 144 mmol/L 140  Potassium 3.5 - 5.2 mmol/L 4.1  Chloride 96 - 106 mmol/L 103  Glucose 65 - 99 mg/dL 93  BUN 8 - 27 mg/dL 15  Creatinine 0.76 - 1.27 mg/dL 1.31 (H)  Calcium 8.6 - 10.2 mg/dL 9.3  BUN/Creatinine Ratio 10 - 24  11  eGFR >59 mL/min/1.73 61  Phosphorus 2.8 - 4.1 mg/dL 2.7 (L)  Alkaline Phosphatase 44 - 121 IU/L 84  Albumin 3.8 - 4.8 g/dL 4.9 (H)  Albumin/Globulin Ratio 1.2 - 2.2  1.9  Uric Acid 3.8 - 8.4 mg/dL 6.9  AST 0 - 40 IU/L 21  ALT 0 - 44 IU/L 29  Total Protein 6.0 - 8.5 g/dL 7.5  Total Bilirubin 0.0 - 1.2 mg/dL 0.7  GGT 0 - 65 IU/L 25  Estimated CHD Risk 0.0 - 1.0 times avg. 1.0  LDH 121 - 224 IU/L 191  Total CHOL/HDL Ratio 0.0 - 5.0 ratio 4.9  Cholesterol, Total 100 - 199 mg/dL 212 (H)  HDL Cholesterol >39 mg/dL 43  Triglycerides 0 - 149 mg/dL 187 (H)  VLDL Cholesterol Cal 5 - 40 mg/dL 34  LDL Chol Calc (NIH) 0 - 99 mg/dL 135 (H)  Iron 38 - 169 ug/dL 81  Globulin, Total 1.5 - 4.5 g/dL 2.6  WBC 3.4 - 10.8 x10E3/uL 6.4  RBC 4.14 - 5.80 x10E6/uL 5.74  Hemoglobin 13.0 - 17.7 g/dL 17.3  HCT 37.5 - 51.0 % 51.4 (H)  MCV 79 - 97 fL 90  MCH 26.6 - 33.0 pg  30.1  MCHC 31.5 - 35.7 g/dL 33.7  RDW 11.6 - 15.4 % 12.7  Platelets 150 - 450 x10E3/uL 195  Neutrophils Not Estab. % 56  Immature Granulocytes Not Estab. % 0  NEUT# 1.4 - 7.0 x10E3/uL 3.6  Lymphocyte # 0.7 - 3.1 x10E3/uL 2.3  Monocytes Absolute 0.1 - 0.9 x10E3/uL 0.4  Basophils Absolute 0.0 - 0.2 x10E3/uL 0.0  Immature Grans (Abs) 0.0 - 0.1 x10E3/uL 0.0  Lymphs Not Estab. % 35  Monocytes Not Estab. % 7  Basos Not Estab. % 1  Eos Not Estab. % 1  EOS (ABSOLUTE) 0.0 - 0.4 x10E3/uL 0.1  Hemoglobin A1C 4.8 - 5.6 % 6.0 (H)  TSH 0.450 - 4.500 uIU/mL 1.050  Thyroxine (T4) 4.5 - 12.0 ug/dL 5.4  Free Thyroxine Index 1.2 - 4.9  1.4  T3 Uptake Ratio 24 - 39 % 26  Prostate Specific Ag, Serum 0.0 - 4.0 ng/mL 0.6  (H): Data is abnormally high (L): Data is abnormally low  Given 90 day supply atorvastatin 4056mo daily #90 RF0 from [facility] today.  Discussed  needs follow up with [facility] for new Rx and labs Sep 2023.  Patient verbalized understanding information/instructions, agreed with plan of care and had no further questions at this time.

## 2022-03-22 MED ORDER — ATORVASTATIN CALCIUM 40 MG PO TABS: 40.0000 mg | ORAL_TABLET | Freq: Every day | ORAL | 0 refills | Status: DC

## 2022-03-28 ENCOUNTER — Telehealth: Payer: Self-pay | Admitting: Registered Nurse

## 2022-03-28 ENCOUNTER — Encounter: Payer: Self-pay | Admitting: Registered Nurse

## 2022-03-28 DIAGNOSIS — H9313 Tinnitus, bilateral: Secondary | ICD-10-CM

## 2022-03-28 NOTE — Telephone Encounter (Signed)
No improvement with conservative treatment.  I recommend referral to ENT.  Patient lives in [place] prefers network provider.  Discussed with patient I would review insurance website and bring list of network providers close to his home for review then enter referral for him and given him provider telephone number/address to call in 1-2 weeks if not contacted by providers office after 1 week.  Patient reported ear ringing continues same level both ear denied new symptoms.  Did not respond to claritin or meclizine.  Air fluid level noted clear bilateral TMs.  No cough/congestion nasal noted in clinic today.  Skin warm dry and pink respirations even and unlabored spoke full sentences without difficulty. Grossly normal  neuro exam/at baseline.  Patient verbalized understanding information/instructions, agreed with plan of care and had no further questions at this time.

## 2022-03-30 NOTE — Telephone Encounter (Signed)
Patient chose [facility] [address] [place], [place] [address] [phone] telephone.  Message left for referral coordinator to verify if office note/insurance information needs to be faxed or if epic referral sufficient.

## 2022-04-04 NOTE — Telephone Encounter (Signed)
Contacted Dr Pollyann Kennedy office again.  Stated fax [phone] and referral to be faxed new patients first available at this time booking Sep.

## 2022-04-05 ENCOUNTER — Telehealth: Payer: Self-pay | Admitting: Registered Nurse

## 2022-04-05 ENCOUNTER — Encounter: Payer: Self-pay | Admitting: Registered Nurse

## 2022-04-05 DIAGNOSIS — H9313 Tinnitus, bilateral: Secondary | ICD-10-CM

## 2022-04-05 NOTE — Telephone Encounter (Signed)
Patient notified referral faxed today by RN Katrinka Blazing to his preferred provider's office.  If not contacted he is to call office to schedule appt next week.  Discussed with patient first available appts now booking in September for new patients per office staff.  Patient verbalized understanding information/instructions, agreed with plan of care and had no further questions at this time.  Patient given provider name, address and telephone number.  Susy Frizzle MD  [facility] [address], [place] [address]  telephone [phone].

## 2022-05-06 NOTE — Telephone Encounter (Signed)
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  Denied changes in symptoms.  Awaiting his appt no further questions or concerns at this time.

## 2022-06-12 ENCOUNTER — Encounter: Payer: Self-pay | Admitting: Gastroenterology

## 2022-06-13 ENCOUNTER — Other Ambulatory Visit: Payer: Self-pay

## 2022-06-13 DIAGNOSIS — R7303 Prediabetes: Secondary | ICD-10-CM

## 2022-06-13 NOTE — Progress Notes (Signed)
Right antecubital stuck without success. 2x2 and coflex applied.  Labs drawn from L wrist with 23g butterfly without difficulty.  2x2 and pressure applied.  2x2s removed and Bandaids applied to both sites. Patient tolerated procedure well.

## 2022-06-14 LAB — HGB A1C W/O EAG: Hgb A1c MFr Bld: 6 % — ABNORMAL HIGH (ref 4.8–5.6)

## 2022-06-21 DIAGNOSIS — H903 Sensorineural hearing loss, bilateral: Secondary | ICD-10-CM | POA: Insufficient documentation

## 2022-06-26 ENCOUNTER — Telehealth: Payer: Self-pay | Admitting: Registered Nurse

## 2022-06-26 ENCOUNTER — Encounter: Payer: Self-pay | Admitting: Registered Nurse

## 2022-06-26 DIAGNOSIS — H9313 Tinnitus, bilateral: Secondary | ICD-10-CM

## 2022-06-26 DIAGNOSIS — R7303 Prediabetes: Secondary | ICD-10-CM

## 2022-06-26 NOTE — Telephone Encounter (Signed)
Epic reviewed otolaryngology and audiology notes/consult.  Audiometry: Both ears show normal hearing except for mild sensorineural loss at 8000 Hz in the right ear and moderate sensorineural loss at 8000 Hz in the left ear. SRT's: 15 dB HL in both ears. Word recognition testing: Not completed due to language barrier Tympanometry: Type A tympanograms in both ears.  Procedures:  None  Impression & Plans:   1) sensorineural hearing loss both ears 2) tinnitus both ears  Hearing protection discussed Masking techniques discussed Return to clinic 1 year audio first or as needed if any changes noted  Camelia Eng. [place], PA-C [facility]    Spoke with patient in workcenter and he understood test results/recommendations from provider.  Discussed how to schedule appt with dietitian [employer] and given another printed copy of how to contact Melonie per his request.  Patient denied worsening of tinnitus chronic symptoms or new ear symptoms.  Patient agreed with plan of care and had no further questions at this time

## 2022-08-24 ENCOUNTER — Encounter: Payer: Self-pay | Admitting: Registered Nurse

## 2022-08-24 ENCOUNTER — Ambulatory Visit: Payer: Self-pay | Admitting: Registered Nurse

## 2022-08-24 VITALS — BP 130/78 | HR 67 | Temp 97.0°F

## 2022-08-24 DIAGNOSIS — R7303 Prediabetes: Secondary | ICD-10-CM

## 2022-08-24 DIAGNOSIS — Z0000 Encounter for general adult medical examination without abnormal findings: Secondary | ICD-10-CM

## 2022-08-24 DIAGNOSIS — M7651 Patellar tendinitis, right knee: Secondary | ICD-10-CM

## 2022-08-24 DIAGNOSIS — E785 Hyperlipidemia, unspecified: Secondary | ICD-10-CM

## 2022-08-24 MED ORDER — BIOFREEZE 4 % EX GEL: 1.0000 | Freq: Four times a day (QID) | CUTANEOUS | 0 refills | Status: AC | PRN

## 2022-08-24 MED ORDER — ACETAMINOPHEN 500 MG PO TABS: 1000.0000 mg | ORAL_TABLET | Freq: Four times a day (QID) | ORAL | 0 refills | Status: AC | PRN

## 2022-08-24 MED ORDER — ATORVASTATIN CALCIUM 40 MG PO TABS: 40.0000 mg | ORAL_TABLET | Freq: Every day | ORAL | 0 refills | Status: DC

## 2022-08-24 NOTE — Patient Instructions (Signed)
Patellar Tendinitis Rehab Ask your health care provider which exercises are safe for you. Do exercises exactly as told by your health care provider and adjust them as directed. It is normal to feel mild stretching, pulling, tightness, or discomfort as you do these exercises. Stop right away if you feel sudden pain or your pain gets worse. Do not begin these exercises until told by your health care provider. Stretching and range-of-motion exercise This exercise warms up your muscles and joints and improves the movement and flexibility of your knee. The exercise also helps to relieve pain and stiffness. Hamstring, doorway stretch This is an exercise in which you lie in a doorway and prop your leg on a wall to stretch the back of your knee and thigh (hamstring). Lie on your back in front of a doorway with your left / right leg resting against the wall and your other leg flat on the floor in the doorway. There should be a slight bend in your left / right knee. Straighten your left / right knee. You should feel a stretch behind your knee or thigh. If you do not, scoot your buttocks closer to the door. Hold this position for ____5-15______ seconds. Repeat ____3______ times. Complete this exercise _____2_____ times a day. Strengthening exercises These exercises build strength and endurance in your knee. Endurance is the ability to use your muscles for a long time, even after they get tired. Quadriceps, isometric This exercise stretches the muscles in front of your thigh (quadriceps) without moving your knee joint (isometric). Lie on your back with your left / right leg extended and your other knee bent. Slowly tense the muscles in the front of your left / right thigh. When you do this, you should see your kneecap slide up toward your hip or see increased dimpling just above the knee. This motion will push the back of your knee toward the floor. If this is painful, try putting a rolled-up hand towel under  your knee to support it in a bent position. Change the size of the towel to find a position that allows you to do this exercise without any pain. For ___5-15_______ seconds, hold the muscle as tight as you can without increasing your pain. Relax the muscles slowly and completely. Repeat _____3_____ times. Complete this exercise _____2_____ times a day. Straight leg raises, flexors This exercise stretches the muscles in front of your thigh (quadriceps) and the muscles that move your hips (hip flexors). Lie on your back with your left / right leg extended and your other knee bent. Tense the muscles in the front of your left / right thigh. When you do this, you should see your kneecap slide up or see increased dimpling just above the knee. Keep these muscles tight as you raise your leg 4-6 inches (10-15 cm) off the floor. Do not let your moving knee bend. Hold this position for __5-15________ seconds. Keep these muscles tense as you slowly lower your leg. Relax your muscles slowly and completely. Repeat ____3______ times. Complete this exercise ______2____ times a day. Squats This is a weight-bearing exercise in which you bend your knees and lower your hips while engaging your thigh muscles. Stand in front of a table, with your feet and knees pointing straight ahead. You may rest your hands on the table for balance but not for support. Slowly bend your knees and lower your hips like you are going to sit in a chair. Keep your weight over your heels, not over your toes.  Keep your lower legs upright so they are parallel with the table legs. Do not let your hips go lower than your knees. Do not bend lower than told by your health care provider. If your knee pain increases, do not bend as low. Hold the squat position for _____5-15_____ seconds. Slowly push with your legs to return to standing. Do not use your hands to pull yourself to standing. Repeat ____3______ times. Complete this exercise  ___2_______ times a day. Step-downs This is an exercise in which you step down slowly while engaging your leg muscles. Stand on the edge of a step. Keeping your weight over your left / right heel, slowly bend your left / right knee to bring your left / right heel toward the floor. Lower your heel as far as you can while keeping control and without increasing any discomfort. Do not let your left / right knee come forward. Use your leg muscles, not gravity, to lower your body. Hold on to a wall or rail for balance if needed. Slowly push through your heel to lift your body weight back up. Return to the starting position. Repeat ____3______ times. Complete this exercise ____2______ times a day. Straight leg raises, abductors This exercise strengthens the muscles that rotate the leg at the hip and move it away from your body (hip abductors). Lie on your side with your left / right leg in the top position. Lie so your head, shoulder, knee, and hip line up. You may bend your bottom knee to help you keep your balance. Roll your hips slightly forward, so that your hips are stacked directly over each other and your left / right knee is facing forward. Leading with your heel, lift your top leg 4-6 inches (10-15 cm). You should feel the muscles in your outer hip lifting. Do not let your foot drift forward. Do not let your knee roll toward the ceiling. Hold this position for ___5-15_______ seconds. Slowly lower your leg to the starting position. Let your muscles relax completely after each repetition. Repeat ___3_______ times. Complete this exercise _____2_____ times a day. This information is not intended to replace advice given to you by your health care provider. Make sure you discuss any questions you have with your health care provider. Document Revised: 04/05/2021 Document Reviewed: 04/05/2021 Elsevier Patient Education  2023 [employer] Patellar Tendinitis  Patellar tendinitis is also called  jumper's knee or patellar tendinopathy. This condition happens when there is damage to the patellar tendon. Tendons are cord-like tissues that connect muscles to bones. The patellar tendon connects the bottom of the kneecap (patella) to the top of the shin bone (tibia). Patellar tendinitis causes pain in the front of the knee. The condition is classified into the following stages: Stage 1: You have pain only after activity. Stage 2: You have pain during and after activity. Stage 3: You have pain at rest as well as during and after activity. The pain limits your ability to do the activity. Stage 4: You have tendon tears. The tears severely limit your activity. What are the causes? This condition is caused by repeated (repetitive) stress on the tendon. This stress may cause the tendon to stretch, swell, thicken, or tear. What increases the risk? The following factors may make you more likely to develop this condition: Participating in sports that involve running, kicking, and jumping, especially on hard surfaces. These include: Basketball. Volleyball. Soccer. Track and field. Training too hard. Having tight thigh muscles. Having received steroid injections in the  tendon. Having had knee surgery. Being 68-76 years old. Having rheumatoid arthritis, diabetes, or kidney disease. These conditions interrupt blood flow to the knee, causing the tendon to weaken. What are the signs or symptoms? The main symptom of this condition is pain and swelling in the front of the knee. The pain usually starts slowly and gradually gets worse. It may become painful to straighten your leg. The pain may get worse when you walk, run, or jump. How is this diagnosed? This condition may be diagnosed based on: Your symptoms. Your medical history. A physical exam. During the physical exam, your health care provider may check for: Tenderness along the tendon just below the patella. Tightness in your thigh muscles. Pain  when you straighten your knee. Imaging tests, including: X-rays. These will show the position and condition of your patella. An MRI. This will show any abnormality of the tendon. Ultrasound. This will show any swelling or other abnormalities of the tendon. How is this treated? Treatment for this condition depends on the stage of the condition. It may involve: Avoiding activities that cause pain, such as jumping. Icing and elevating your knee. Having sound wave stimulation to promote healing. Doing physical therapy exercises to improve movement and strength in your knee when pain and swelling improve. Wearing a knee brace. This may be needed if your condition does not improve with treatment. Using crutches or a walker. This may be needed if your condition does not improve with treatment. Surgery. This may be done if you have stage 4 tendinitis. Follow these instructions at home: If you have a removable brace: Wear the brace as told by your health care provider. Remove it only as told by your health care provider. Check the skin around the brace every day. Tell your health care provider about any concerns. Loosen the brace if your toes tingle, become numb, or turn cold and blue. Keep the brace clean. If the brace is not waterproof: Do not let it get wet. Cover it with a watertight covering when you take a bath or shower. Ask your health care provider when it is safe for you to drive. Managing pain, stiffness, and swelling  If directed, put ice on the injured area. To do this: If you have a removable brace, remove it as told by your health care provider. Put ice in a plastic bag. Place a towel between your skin and the bag. Leave the ice on for 20 minutes, 2-3 times a day. Remove the ice if your skin turns bright red. This is very important. If you cannot feel pain, heat, or cold, you have a greater risk of damage to the area. Move your toes often to reduce stiffness and swelling. Raise  (elevate) your knee above the level of your heart while you are sitting or lying down. Activity Do not use the injured limb to support your body weight until your health care provider says that you can. Use your crutches or a walker as told by your health care provider. Do exercises as told by your health care provider or physical therapist. Return to your normal activities as told by your health care provider. Ask your health care provider what activities are safe for you. General instructions Take over-the-counter and prescription medicines only as told by your health care provider. Do not use any products that contain nicotine or tobacco. These products include cigarettes, chewing tobacco, and vaping devices, such as e-cigarettes. These can delay healing. If you need help quitting, ask your  health care provider. Keep all follow-up visits. This is important. How is this prevented? Warm up and stretch before being active. Cool down and stretch after being active. Give your body time to rest between periods of activity. You may need to reduce how often you play a sport that requires frequent jumping. Make sure to use equipment that fits you. Be safe and responsible while being active. This will help you avoid falls which can damage the tendon. Do at least 150 minutes of moderate-intensity exercise each week, such as brisk walking or water aerobics. Maintain physical fitness, including: Strength. Flexibility. Cardiovascular fitness. Endurance. Contact a health care provider if: Your symptoms have not improved in 6 weeks. Your symptoms get worse. Summary Patellar tendinitis is also called jumper's knee or patellar tendinopathy. This condition happens when there is damage to the patellar tendon. Treatment for this condition depends on the stage of the condition and may include rest, ice, exercises, a knee brace, and surgery. Do not use the injured limb to support your body weight until your  health care provider says that you can. Take over-the-counter and prescription medicines only as told by your health care provider. This information is not intended to replace advice given to you by your health care provider. Make sure you discuss any questions you have with your health care provider. Document Revised: 04/05/2021 Document Reviewed: 04/05/2021 Elsevier Patient Education  [employer].

## 2022-08-24 NOTE — Progress Notes (Signed)
Subjective:    Patient ID: Jason Swanson, male    DOB: 1956-02-14, 66 y.o.   MRN: [id number]  66y/o african [place] male here for right knee pain/lateral leg pain.  Denied known injury hasn't been working overtime doesn't have second job.  States most painful after work when sitting down.  Denied rash/ecchymosis/weakness/giving out or locking up. Requested refill cholesterol medication did not schedule appt with PCM as office too far away would like to establish with new PCM closer to work      Review of Systems  Constitutional:  Negative for activity change, appetite change, chills, fatigue and fever.  HENT:  Negative for congestion.   Eyes:  Negative for visual disturbance.  Respiratory:  Negative for cough.   Cardiovascular:  Negative for leg swelling.  Musculoskeletal:  Positive for arthralgias and myalgias. Negative for gait problem and joint swelling.  Skin:  Negative for color change, rash and wound.  Neurological:  Negative for tremors, seizures, syncope, speech difficulty, weakness and numbness.  Hematological:  Negative for adenopathy. Does not bruise/bleed easily.  Psychiatric/Behavioral:  Negative for agitation, confusion and sleep disturbance.        Objective:   Physical Exam Vitals and nursing note reviewed.  Constitutional:      General: He is awake. He is not in acute distress.    Appearance: Normal appearance. He is well-developed and well-groomed. He is not ill-appearing, toxic-appearing or diaphoretic.  HENT:     Head: Normocephalic and atraumatic.     Jaw: There is normal jaw occlusion.     Salivary Glands: Right salivary gland is not diffusely enlarged. Left salivary gland is not diffusely enlarged.     Right Ear: Hearing and external ear normal.     Left Ear: Hearing and external ear normal.     Nose: Nose normal. No congestion or rhinorrhea.     Mouth/Throat:     Lips: Pink. No lesions.     Mouth: Mucous membranes are moist.     Pharynx:  Oropharynx is clear.  Eyes:     General: Lids are normal. Vision grossly intact. Gaze aligned appropriately. No scleral icterus.       Right eye: No discharge.        Left eye: No discharge.     Extraocular Movements: Extraocular movements intact.     Conjunctiva/sclera: Conjunctivae normal.     Pupils: Pupils are equal, round, and reactive to light.  Neck:     Trachea: Trachea normal.  Cardiovascular:     Rate and Rhythm: Normal rate and regular rhythm.     Pulses: Normal pulses.  Pulmonary:     Effort: Pulmonary effort is normal.     Breath sounds: Normal breath sounds and air entry. No stridor or transmitted upper airway sounds. No wheezing.     Comments: Spoke full sentences without difficulty; no cough observed in exam room Abdominal:     Palpations: Abdomen is soft.  Musculoskeletal:        General: Tenderness present. No swelling, deformity or signs of injury.     Right hand: Normal strength. Normal capillary refill.     Left hand: Normal strength. Normal capillary refill.     Cervical back: Normal range of motion and neck supple. No rigidity.     Right upper leg: No swelling, edema, deformity, lacerations, tenderness or bony tenderness.     Left upper leg: Swelling and tenderness present. No edema, deformity, lacerations or bony tenderness.  Right knee: No swelling, deformity, effusion, erythema, ecchymosis, lacerations or crepitus. Decreased range of motion. No tenderness. No medial joint line, lateral joint line, MCL, LCL, ACL, PCL or patellar tendon tenderness. No LCL laxity, MCL laxity, ACL laxity or PCL laxity.     Left knee: Swelling and crepitus present. No deformity, effusion, erythema, ecchymosis, lacerations or bony tenderness. Decreased range of motion. Tenderness present over the lateral joint line, LCL and patellar tendon. No medial joint line, MCL, ACL or PCL tenderness. No LCL laxity, MCL laxity, ACL laxity or PCL laxity.    Right lower leg: No edema.     Left  lower leg: No edema.       Legs:     Comments: Diameter left 17.5inches over central patella/fossa; crepitus with knee arom left flexion/extension and fullness palpation patellar tendon lateral; flexion bilateral knees decreased foot to buttock with 10 inch gap; not exquisitely TTP; hamstrings tight bilaterally and left lateral hamstring TTP no palpable defect; increased tissue tenseness in area that is TTP  Lymphadenopathy:     Head:     Right side of head: No submandibular or preauricular adenopathy.     Left side of head: No submandibular or preauricular adenopathy.     Cervical:     Right cervical: No superficial cervical adenopathy.    Left cervical: No superficial cervical adenopathy.  Skin:    General: Skin is warm and dry.     Capillary Refill: Capillary refill takes less than 2 seconds.     Coloration: Skin is not ashen, cyanotic, jaundiced, mottled, pale or sallow.     Findings: No abrasion, abscess, acne, bruising, burn, ecchymosis, erythema, signs of injury, laceration, lesion, petechiae, rash or wound.  Neurological:     General: No focal deficit present.     Mental Status: He is alert and oriented to person, place, and time. Mental status is at baseline.     GCS: GCS eye subscore is 4. GCS verbal subscore is 5. GCS motor subscore is 6.     Cranial Nerves: Cranial nerves 2-12 are intact. No cranial nerve deficit, dysarthria or facial asymmetry.     Sensory: Sensation is intact.     Motor: Motor function is intact. No weakness, tremor, abnormal muscle tone or seizure activity.     Coordination: Coordination is intact. Coordination normal.     Gait: Gait is intact. Gait normal.     Comments: In/out of chair and on/off exam table without difficulty; gait sure and steady in clinic; bilateral hand grasp equal 5/5  Psychiatric:        Attention and Perception: Attention and perception normal.        Mood and Affect: Mood and affect normal.        Speech: Speech normal.         Behavior: Behavior normal. Behavior is cooperative.        Thought Content: Thought content normal.        Cognition and Memory: Cognition and memory normal.        Judgment: Judgment normal.     1500 patient seen in workcenter stated neoprene sleeve knee left helping a great deal less pain more comfortable.   Latest Reference Range & Units 05/25/21 07:30 05/30/21 09:00 11/03/21 10:50  COMPREHENSIVE METABOLIC PANEL  Rpt !    Sodium 134 - 144 mmol/L 139 140 142  Potassium 3.5 - 5.2 mmol/L 3.9 4.1 4.0  Chloride 96 - 106 mmol/L 104 103 101  CO2 20 -  29 mmol/L 26  22  Glucose 70 - 99 mg/dL 105 (H) 93 80  BUN 8 - 27 mg/dL _0 Creatinine 0.76 - 1.27 mg/dL 1.15 1.31 (H) 1.05  Calcium 8.6 - 10.2 mg/dL 9.6 9.3 9.3  Anion gap 5 - 15  9    BUN/Creatinine Ratio 10 - _1 eGFR >59 mL/min/1.73  61 79  Phosphorus 2.8 - 4.1 mg/dL  2.7 (L)   Alkaline Phosphatase 44 - 121 IU/L 64 84   Albumin 3.8 - 4.8 g/dL 4.6 4.9 (H)   Albumin/Globulin Ratio 1.2 - 2.2   1.9   Uric Acid 3.8 - 8.4 mg/dL  6.9   Lipase 11 - 51 U/L 26    AST 0 - 40 IU/L 22 21   ALT 0 - 44 IU/L 35 29   Total Protein 6.0 - 8.5 g/dL 7.6 7.5   Total Bilirubin 0.0 - 1.2 mg/dL 1.0 0.7   GGT 0 - 65 IU/L  25   GFR, Estimated >60 mL/min >60    Estimated CHD Risk 0.0 - 1.0 times avg.  1.0   LDH 121 - 224 IU/L  191   Total CHOL/HDL Ratio 0.0 - 5.0 ratio  4.9   Cholesterol, Total 100 - 199 mg/dL  212 (H)   HDL Cholesterol >39 mg/dL  43   Triglycerides 0 - 149 mg/dL  187 (H)   VLDL Cholesterol Cal 5 - 40 mg/dL  34   LDL Chol Calc (NIH) 0 - 99 mg/dL  135 (H)   Iron 38 - 169 ug/dL  81   Globulin, Total 1.5 - 4.5 g/dL  2.6   WBC 3.4 - 10.8 x10E3/uL  6.4   RBC 4.14 - 5.80 x10E6/uL  5.74   Hemoglobin 13.0 - 17.7 g/dL  17.3   HCT 37.5 - 51.0 %  51.4 (H)   MCV 79 - 97 fL  90   MCH 26.6 - 33.0 pg  30.1   MCHC 31.5 - 35.7 g/dL  33.7   RDW 11.6 - 15.4 %  12.7   Platelets 150 - 450 x10E3/uL  195   Neutrophils Not Estab. %  56    Immature Granulocytes Not Estab. %  0   NEUT# 1.4 - 7.0 x10E3/uL  3.6   Lymphocyte # 0.7 - 3.1 x10E3/uL  2.3   Monocytes Absolute 0.1 - 0.9 x10E3/uL  0.4   Basophils Absolute 0.0 - 0.2 x10E3/uL  0.0   Immature Grans (Abs) 0.0 - 0.1 x10E3/uL  0.0   Lymphs Not Estab. %  35   Monocytes Not Estab. %  7   Basos Not Estab. %  1   Eos Not Estab. %  1   EOS (ABSOLUTE) 0.0 - 0.4 x10E3/uL  0.1   Hemoglobin A1C 4.8 - 5.6 %  6.0 (H) 5.8 (H)  TSH 0.450 - 4.500 uIU/mL  1.050   Thyroxine (T4) 4.5 - 12.0 ug/dL  5.4   Free Thyroxine Index 1.2 - 4.9   1.4   T3 Uptake Ratio 24 - 39 %  26   Prostate Specific Ag, Serum 0.0 - 4.0 ng/mL  0.6   !: Data is abnormal (H): Data is abnormally high (L): Data is abnormally low Rpt: View report in Results Review for more information    Assessment & Plan:   A-left patellar tendonitis; hyperlipidemia, preventive healthcare  P-Patient was instructed to decrease walking/kneeling/squatting/ladders/stairs after work.  Consider splitting time at work with chair/standing  as typically stands entire shift on concrete floors.  Avoid running/jumping/squats and lunges until symptoms improved. Elevate legs when sitting.  May continue aleve 551m po BID take with food. Biofreeze gel topical QID prn.  Tylenol 10073mpo QID prn pain.  Call or return to clinic as needed if these symptoms worsen or fail to improve as anticipated.  Follow up re-evaluation in 2 weeks or sooner prn worsening.  If patient requires work restrictions will need appt with [facility]ics or [facility] as restricted by contract in this clinic to write work restrictions.  Patient to see RN KiEvlyn Kannero schedule.  [facility] handouts on patellar tendonitis and rehab exercises printed and given to patient.  Gentle AROM and wearing knee neoprene sleeve while active.  Fitted and distributed from clinic stock by me to patient.  Do not need to wear knee support while sleeping.  May hand wash knee support.  Do  not put in washer or dryer.  May use blow dryer to speed drying.  Cryotherapy 15 minutes QID prn pain/swelling.  [facility] required as I am unable to write or release patient from restrictions in this clinic due to contract limitations. Re-evaluation with me in 2 weeks.  Patient verbalized agreement and understanding of treatment plan and had no further questions at this time.    Exec panel make with [id number] today as last labs fall 2022.  Bridge refilled atorvastatin 4061mo daily #90 RF0 from [facility] to patient today.  Discussed with patient see RN KimEvlyn Kannernday for lab results.  Clinic closed Friday -- Sunday.  RN Kimrey assisting patient to find new PCM that is network provider for his insurance.  Nonfasting labs today had bread, beverage today.  Patient agreed with plan of care and had no further questions at that time. P2: ROM exercises, Stretching, and Hand outs given

## 2022-08-24 NOTE — Progress Notes (Signed)
Lab drawn from Left AC tolerated well no issues noted.   

## 2022-08-25 LAB — CMP12+LP+TP+TSH+6AC+PSA+CBC…
ALT: 28 IU/L (ref 0–44)
AST: 21 IU/L (ref 0–40)
Albumin/Globulin Ratio: 1.8 (ref 1.2–2.2)
Albumin: 4.6 g/dL (ref 3.9–4.9)
Alkaline Phosphatase: 76 IU/L (ref 44–121)
BUN/Creatinine Ratio: 10 (ref 10–24)
BUN: 12 mg/dL (ref 8–27)
Basophils Absolute: 0 10*3/uL (ref 0.0–0.2)
Basos: 1 %
Bilirubin Total: 1.1 mg/dL (ref 0.0–1.2)
Calcium: 9.4 mg/dL (ref 8.6–10.2)
Chloride: 101 mmol/L (ref 96–106)
Chol/HDL Ratio: 3.8 ratio (ref 0.0–5.0)
Cholesterol, Total: 137 mg/dL (ref 100–199)
Creatinine, Ser: 1.15 mg/dL (ref 0.76–1.27)
EOS (ABSOLUTE): 0.1 10*3/uL (ref 0.0–0.4)
Eos: 1 %
Estimated CHD Risk: 0.7 times avg. (ref 0.0–1.0)
Free Thyroxine Index: 1.4 (ref 1.2–4.9)
GGT: 18 IU/L (ref 0–65)
Globulin, Total: 2.6 g/dL (ref 1.5–4.5)
Glucose: 102 mg/dL — ABNORMAL HIGH (ref 70–99)
HDL: 36 mg/dL — ABNORMAL LOW (ref >39)
Hematocrit: 49.6 % (ref 37.5–51.0)
Hemoglobin: 16.5 g/dL (ref 13.0–17.7)
Immature Grans (Abs): 0 10*3/uL (ref 0.0–0.1)
Immature Granulocytes: 0 %
Iron: 95 ug/dL (ref 38–169)
LDH: 174 IU/L (ref 121–224)
LDL Chol Calc (NIH): 72 mg/dL (ref 0–99)
Lymphocytes Absolute: 2.1 10*3/uL (ref 0.7–3.1)
Lymphs: 30 %
MCH: 30.3 pg (ref 26.6–33.0)
MCHC: 33.3 g/dL (ref 31.5–35.7)
MCV: 91 fL (ref 79–97)
Monocytes Absolute: 0.6 10*3/uL (ref 0.1–0.9)
Monocytes: 8 %
Neutrophils Absolute: 4.2 10*3/uL (ref 1.4–7.0)
Neutrophils: 60 %
Phosphorus: 2.6 mg/dL — ABNORMAL LOW (ref 2.8–4.1)
Platelets: 184 10*3/uL (ref 150–450)
Potassium: 3.8 mmol/L (ref 3.5–5.2)
Prostate Specific Ag, Serum: 0.4 ng/mL (ref 0.0–4.0)
RBC: 5.45 x10E6/uL (ref 4.14–5.80)
RDW: 12.8 % (ref 11.6–15.4)
Sodium: 136 mmol/L (ref 134–144)
T3 Uptake Ratio: 28 % (ref 24–39)
T4, Total: 5.1 ug/dL (ref 4.5–12.0)
TSH: 0.827 u[IU]/mL (ref 0.450–4.500)
Total Protein: 7.2 g/dL (ref 6.0–8.5)
Triglycerides: 168 mg/dL — ABNORMAL HIGH (ref 0–149)
Uric Acid: 6 mg/dL (ref 3.8–8.4)
VLDL Cholesterol Cal: 29 mg/dL (ref 5–40)
WBC: 7 10*3/uL (ref 3.4–10.8)
eGFR: 71 mL/min/{1.73_m2} (ref >59)

## 2022-08-25 LAB — HGB A1C W/O EAG: Hgb A1c MFr Bld: 5.9 % — ABNORMAL HIGH (ref 4.8–5.6)

## 2022-08-26 ENCOUNTER — Encounter: Payer: Self-pay | Admitting: Registered Nurse

## 2022-08-26 DIAGNOSIS — R7303 Prediabetes: Secondary | ICD-10-CM

## 2022-08-26 DIAGNOSIS — M7651 Patellar tendinitis, right knee: Secondary | ICD-10-CM

## 2022-08-26 NOTE — Addendum Note (Signed)
Addended by: Albina Billet A on: 08/26/2022 08:04 AM   Modules accepted: Orders

## 2022-08-28 ENCOUNTER — Ambulatory Visit: Payer: Self-pay | Admitting: Occupational Medicine

## 2022-08-28 DIAGNOSIS — Z0000 Encounter for general adult medical examination without abnormal findings: Secondary | ICD-10-CM

## 2022-08-28 NOTE — Progress Notes (Signed)
Follow up with PCM elevated triglycerides and low HDL.  Continue your cholesterol medication it has helped a great deal to lower your cholesterol and risk of stroke and heart attack.  Your spot and 3 month blood sugar still elevated but improved from 2022.  Keep doing activity 150 minutes per week, drink water to avoid dehydration and exercising if you have a heavy added sugar/carbohydrate greater than 50gms snack or meal. [facility] has a dietitian that offers 6 free visits I recommend making appt with them. I have requested a vitamin D level from [facility] results still pending. Repeat Hgba1c nonfasting in 3 months.  I recommend weight loss, exercise 150 minutes per week; dietary fiber 30 grams per day men per up to date; eat whole grains/fruits/vegetables; keep added sugars to less than 150 calories/9 teaspoons for men per American [employer] Association;  prostate, electrolytes, iron, kidney/liver function, thyroid and complete blood count normal  No anemia or infection on complete blood count.  Please let us know if you have further questions or concerns.  Keep appt with new PCM. March 29 th with Hillard Danker.  I recommend covid booster and RSV vaccine at local pharmacy.  Pt reports got the flu shot this year already at [facility].  Consider appt with [facility] dietitian -free x 6 visits; Virtual visits, nights and weekends available typically appts scheduled within 2 weeks of your request.   Patient reports still having pain but is better with Brace to right knee. Fu with Inetta Fermo NP next week.    Participants Receive  Group Accountability Sessions  1:1 Wellness Counseling Sessions  Bluetooth Body Composition Scale - tracks body fat, hydration, muscle mass & more!  Customized Nutrition Plan  Customized Fitness Plan  Customized Self-Care Strategies  Access to Nutrition & Fitness Tracking App  Goal Tracking and Reminders Program Cost [facility] Members receive up to 6 individual Wellness Counseling Sessions at  no cost - Group sessions are offered at no cost if member attends individual sessions Interested in signing up? Follow this link: [email or web address] Want to learn more? Email [email or web address] to find out how this proven outcome program can work for you

## 2022-08-29 ENCOUNTER — Encounter: Payer: Self-pay | Admitting: Registered Nurse

## 2022-08-29 ENCOUNTER — Telehealth: Payer: Self-pay | Admitting: Registered Nurse

## 2022-08-29 ENCOUNTER — Ambulatory Visit: Payer: Self-pay | Admitting: Occupational Medicine

## 2022-08-29 DIAGNOSIS — Z0000 Encounter for general adult medical examination without abnormal findings: Secondary | ICD-10-CM

## 2022-08-29 DIAGNOSIS — E559 Vitamin D deficiency, unspecified: Secondary | ICD-10-CM

## 2022-08-29 LAB — VITAMIN D 25 HYDROXY (VIT D DEFICIENCY, FRACTURES): Vit D, 25-Hydroxy: 9.4 ng/mL — ABNORMAL LOW (ref 30.0–100.0)

## 2022-08-29 LAB — SPECIMEN STATUS REPORT

## 2022-08-29 MED ORDER — CHOLECALCIFEROL 1.25 MG (50000 UT) PO TABS: 1.0000 | ORAL_TABLET | ORAL | 0 refills | Status: AC

## 2022-08-29 NOTE — Progress Notes (Signed)
Vitamin D low. Please verify his pharmacy preference = [facility] [address]. Let him know Inetta Fermo will send Rx Thursday for vitamin D 50,000 units po weekly and printed vitamin D deficiency handout for him. Take with food and repeat vit D level non fasting in 3 months

## 2022-08-29 NOTE — Telephone Encounter (Signed)
RN Kimrey notified to contact patient verify pharmacy preference and let him know exitcae handout on vitamin D deficiency sent to his my chart account.  Start supplement 1 dose weekly with meal and recheck level in 3 months nonfasting

## 2022-09-02 ENCOUNTER — Encounter: Payer: Self-pay | Admitting: Registered Nurse

## 2022-09-02 NOTE — Progress Notes (Signed)
Patient had follow up labs last week Dec 2023 see results note

## 2022-09-02 NOTE — Telephone Encounter (Signed)
See results note and tcon.  Patient reported knee/leg pain improved with using neoprene knee sleeve.  Denied worsening or concerns at this time.  Gait sure and steady in warehouse.

## 2022-09-02 NOTE — Telephone Encounter (Signed)
Patient seen in workcenter notified vitamin D low and Rx sent to his [facility] and to take 1 tablet po weekly with meal and repeat level in 3 months nonfasting.  Patient stated he spoke with RN Bess Kinds regarding other lab results and had no further questions or concerns.  Will repeat Hgba1c in 3 months also.  Discussed scheduling appt with [facility] dietitian for prediabetes.  Patient verbalized understanding information and had no further questions at this time.

## 2022-09-07 ENCOUNTER — Encounter: Payer: Self-pay | Admitting: Registered Nurse

## 2022-09-07 ENCOUNTER — Ambulatory Visit: Payer: Self-pay | Admitting: Registered Nurse

## 2022-09-07 VITALS — BP 135/82 | HR 73

## 2022-09-07 DIAGNOSIS — R030 Elevated blood-pressure reading, without diagnosis of hypertension: Secondary | ICD-10-CM

## 2022-09-07 DIAGNOSIS — M7651 Patellar tendinitis, right knee: Secondary | ICD-10-CM

## 2022-09-07 MED ORDER — ACETAMINOPHEN 500 MG PO TABS: 1000.0000 mg | ORAL_TABLET | Freq: Four times a day (QID) | ORAL | 0 refills | Status: AC | PRN

## 2022-09-07 MED ORDER — BIOFREEZE 4 % EX GEL: 1.0000 | Freq: Four times a day (QID) | CUTANEOUS | 0 refills | Status: AC | PRN

## 2022-09-07 NOTE — Progress Notes (Signed)
Subjective:    Patient ID: Jason Swanson, male    DOB: July 08, 1956, 66 y.o.   MRN: [id number]  66y/o african [place] male here for re-evaluation right knee pain last office visit 08/24/22 was fitted with neoprene knee sleeve and instructed to use tylenol/biofreeze/ice prn  Denied giving out/worsening symptoms.  Improved some still noticing some popping but feeling better not needing tylenol every day.  Using brace at work and icing after work.     Review of Systems  Musculoskeletal:  Positive for arthralgias and myalgias. Negative for back pain, gait problem, joint swelling and neck pain.  Skin:  Negative for color change and rash.  Neurological:  Negative for syncope, weakness and numbness.  Psychiatric/Behavioral:  Negative for agitation, confusion and sleep disturbance.        Objective:   Physical Exam Vitals and nursing note reviewed.  Constitutional:      General: He is awake. He is not in acute distress.    Appearance: Normal appearance. He is well-developed, well-groomed and overweight. He is not ill-appearing, toxic-appearing or diaphoretic.  HENT:     Head: Normocephalic and atraumatic.     Jaw: There is normal jaw occlusion.     Salivary Glands: Right salivary gland is not diffusely enlarged. Left salivary gland is not diffusely enlarged.     Right Ear: Hearing and external ear normal.     Left Ear: Hearing and external ear normal.     Nose: Nose normal. No congestion or rhinorrhea.     Mouth/Throat:     Lips: Pink. No lesions.     Mouth: Mucous membranes are moist.     Pharynx: Oropharynx is clear.  Eyes:     General: Lids are normal. Vision grossly intact. Gaze aligned appropriately. No scleral icterus.       Right eye: No discharge.        Left eye: No discharge.     Extraocular Movements: Extraocular movements intact.     Conjunctiva/sclera: Conjunctivae normal.     Pupils: Pupils are equal, round, and reactive to light.  Neck:     Trachea: Trachea  normal.  Cardiovascular:     Rate and Rhythm: Normal rate and regular rhythm.  Pulmonary:     Effort: Pulmonary effort is normal.     Breath sounds: Normal breath sounds and air entry. No stridor or transmitted upper airway sounds. No wheezing.     Comments: Spoke full sentences without difficulty; no cough observed in exam room Abdominal:     General: Abdomen is flat.  Musculoskeletal:        General: Normal range of motion.     Cervical back: Normal range of motion and neck supple.     Right upper leg: No swelling, edema, deformity, lacerations, tenderness or bony tenderness.     Left upper leg: No swelling, edema, deformity, lacerations, tenderness or bony tenderness.     Right knee: Crepitus present. No swelling, deformity, effusion, erythema, ecchymosis, lacerations or bony tenderness. Normal range of motion. No tenderness. No LCL laxity, MCL laxity, ACL laxity or PCL laxity.     Left knee: No swelling, deformity, effusion, erythema, ecchymosis, lacerations, bony tenderness or crepitus. Normal range of motion. No tenderness. No LCL laxity, MCL laxity, ACL laxity or PCL laxity.Normal alignment.     Right lower leg: No swelling, lacerations or tenderness. No edema.     Left lower leg: No swelling, lacerations or tenderness. No edema.     Right ankle: No  swelling or ecchymosis. No tenderness.     Left ankle: No swelling or ecchymosis. No tenderness.     Comments: Crepitus right knee with flexion/extension audible and palpable; wearing neoprene knee sleeve; neither knee TTP/arom equal bilaterally; negative valgus/varus stress, lachmann's/anterior/posterior drawer signs  Lymphadenopathy:     Head:     Right side of head: No submandibular or preauricular adenopathy.     Left side of head: No submandibular or preauricular adenopathy.     Cervical:     Right cervical: No superficial cervical adenopathy.    Left cervical: No superficial cervical adenopathy.  Skin:    General: Skin is warm  and dry.     Capillary Refill: Capillary refill takes less than 2 seconds.     Coloration: Skin is not ashen, cyanotic, jaundiced, mottled, pale or sallow.     Findings: No abrasion, bruising, burn, erythema, signs of injury, laceration, petechiae, rash or wound.  Neurological:     General: No focal deficit present.     Mental Status: He is alert and oriented to person, place, and time. Mental status is at baseline.     GCS: GCS eye subscore is 4. GCS verbal subscore is 5. GCS motor subscore is 6.     Cranial Nerves: Cranial nerves 2-12 are intact. No cranial nerve deficit, dysarthria or facial asymmetry.     Motor: Motor function is intact. No weakness, tremor, abnormal muscle tone or seizure activity.     Coordination: Coordination is intact. Coordination normal.     Gait: Gait is intact. Gait normal.     Comments: In/out of chair and on/off exam table without difficulty; gait sure and steady in clinic; bilateral hand grasp equal 5/5  Psychiatric:        Attention and Perception: Attention and perception normal.        Mood and Affect: Mood and affect normal.        Speech: Speech normal.        Behavior: Behavior normal. Behavior is cooperative.        Thought Content: Thought content normal.        Cognition and Memory: Cognition and memory normal.        Judgment: Judgment normal.           Assessment & Plan:  A-patellar tendonitis right subsequent visit, elevated blood pressure  P-Improving still with crepitus but pain improved most with knee sleeve/ice/biofreeze.  Continue plan of care to decrease walking/kneeling/squatting/ladders/stairs after work.  Consider splitting time at work with chair/standing as typically stands entire shift on concrete floors.  Avoid running/jumping/squats and lunges until symptoms improved. Elevate legs when sitting.  May continue aleve 500mg  po BID take with food. Biofreeze gel topical QID prn.  Tylenol 1000mg  po QID prn pain.  Call or return to  clinic as needed if these symptoms worsen or fail to improve as anticipated.  Follow up re-evaluation in 2 weeks or sooner prn worsening.  If patient requires work restrictions will need appt with orthopedics or [facility] provider as restricted by contract in this clinic to write work restrictions.  Patient to see RN to schedule.  Gentle AROM and wearing knee neoprene sleeve while active.   Do not need to wear knee support while sleeping.  May hand wash knee support.  Do not put in washer or dryer.  May use blow dryer to speed drying.  Cryotherapy 15 minutes QID prn pain/swelling.  Re-evaluation with me in 2 weeks.  Patient verbalized agreement and understanding of treatment plan and had no further questions at this time.     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  Re-evaluate blood pressure at next appt.  Previous at goal. Consider pain medication.  Avoiding added salt and caffeine in diet rest of day.  Hydrate with water and rest after work today.  Follow up for evaluation if chest pain, worst headache of life, shortness of breath, visual changes for re-evaluation with provider.  Patient verbalized understanding information/instructions, agreed with plan of care and had no further questions at this time.

## 2022-09-21 ENCOUNTER — Encounter: Payer: Self-pay | Admitting: Registered Nurse

## 2022-09-21 ENCOUNTER — Ambulatory Visit: Payer: Self-pay | Admitting: Registered Nurse

## 2022-09-21 DIAGNOSIS — M25561 Pain in right knee: Secondary | ICD-10-CM

## 2022-09-21 NOTE — Patient Instructions (Signed)
Acute Knee Pain, Adult Acute knee pain is sudden and may be caused by damage, swelling, or irritation of the muscles and tissues that support the knee. Pain may result from: A fall. An injury to the knee from twisting motions. A hit to the knee. Infection. Acute knee pain may go away on its own with time and rest. If it does not, your health care provider may order tests to find the cause of the pain. These may include: Imaging tests, such as an X-ray, MRI, CT scan, or ultrasound. Joint aspiration. In this test, fluid is removed from the knee and evaluated. Arthroscopy. In this test, a lighted tube is inserted into the knee and an image is projected onto a TV screen. Biopsy. In this test, a sample of tissue is removed from the body and studied under a microscope. Follow these instructions at home: If you have a knee sleeve or brace:  Wear the knee sleeve or brace as told by your health care provider. Remove it only as told by your health care provider. Loosen it if your toes tingle, become numb, or turn cold and blue. Keep it clean. If the knee sleeve or brace is not waterproof: Do not let it get wet. Cover it with a watertight covering when you take a bath or shower. Activity Rest your knee. Do not do things that cause pain or make pain worse. Avoid high-impact activities or exercises, such as running, jumping rope, or doing jumping jacks. Work with a physical therapist to make a safe exercise program, as recommended by your health care provider. Do exercises as told by your physical therapist. Managing pain, stiffness, and swelling  If directed, put ice on the affected knee. To do this: If you have a removable knee sleeve or brace, remove it as told by your health care provider. Put ice in a plastic bag. Place a towel between your skin and the bag. Leave the ice on for 20 minutes, 2-3 times a day. Remove the ice if your skin turns bright red. This is very important. If you cannot  feel pain, heat, or cold, you have a greater risk of damage to the area. If directed, use an elastic bandage to put pressure (compression) on your injured knee. This may control swelling, give support, and help with discomfort. Raise (elevate) your knee above the level of your heart while you are sitting or lying down. Sleep with a pillow under your knee. General instructions Take over-the-counter and prescription medicines only as told by your health care provider. Do not use any products that contain nicotine or tobacco, such as cigarettes, e-cigarettes, and chewing tobacco. If you need help quitting, ask your health care provider. If you are overweight, work with your health care provider and a dietitian to set a weight-loss goal that is healthy and reasonable for you. Extra weight can put pressure on your knee. Pay attention to any changes in your symptoms. Keep all follow-up visits. This is important. Contact a health care provider if: Your knee pain continues, changes, or gets worse. You have a fever along with knee pain. Your knee feels warm to the touch or is red. Your knee buckles or locks up. Get help right away if: Your knee swells, and the swelling becomes worse. You cannot move your knee. You have severe pain in your knee that cannot be managed with pain medicine. Summary Acute knee pain can be caused by a fall, an injury, an infection, or damage, swelling, or irritation   of the tissues that support your knee. Your health care provider may perform tests to find out the cause of the pain. Pay attention to any changes in your symptoms. Relieve your pain with rest, medicines, light activity, and the use of ice. Get help right away if your knee swells, you cannot move your knee, or you have severe pain that cannot be managed with medicine. This information is not intended to replace advice given to you by your health care provider. Make sure you discuss any questions you have with  your health care provider. Document Revised: 02/11/2020 Document Reviewed: 02/11/2020 Elsevier Patient Education  2023 [employer].  

## 2022-09-21 NOTE — Progress Notes (Signed)
Subjective:    Patient ID: Jason Swanson, male    DOB: 02-May-1956, 67 y.o.   MRN: [id number]  66y/o african [place] male established patient here for re-evaluation right knee pain last seen 09/07/2022.  Right knee medial typically worst after work and sometimes when rolling over in bed at night 7/10 at worst; using knee sleeve and helping not requiring po meds daily or swelling stopped ibuprofen/tylenol/ice.  Denied giving out/weakness/locking/popping/swelling/rash/bruising      Review of Systems  Constitutional:  Negative for chills and fever.  Cardiovascular:  Negative for leg swelling.  Musculoskeletal:  Positive for arthralgias. Negative for back pain, gait problem, joint swelling, myalgias, neck pain and neck stiffness.  Skin:  Negative for color change, rash and wound.  Neurological:  Negative for tremors, weakness and numbness.  Psychiatric/Behavioral:  Positive for sleep disturbance. Negative for agitation and confusion.        Objective:   Physical Exam Vitals and nursing note reviewed.  Constitutional:      General: He is awake. He is not in acute distress.    Appearance: Normal appearance. He is well-developed, well-groomed and overweight. He is not ill-appearing, toxic-appearing or diaphoretic.  HENT:     Head: Normocephalic and atraumatic.     Jaw: There is normal jaw occlusion.     Salivary Glands: Right salivary gland is not diffusely enlarged. Left salivary gland is not diffusely enlarged.     Right Ear: Hearing and external ear normal.     Left Ear: Hearing and external ear normal.     Nose: Nose normal. No congestion or rhinorrhea.     Mouth/Throat:     Lips: Pink. No lesions.     Mouth: Mucous membranes are moist.     Pharynx: Oropharynx is clear.  Eyes:     General: Lids are normal. Vision grossly intact. Gaze aligned appropriately. No scleral icterus.       Right eye: No discharge.        Left eye: No discharge.     Extraocular Movements:  Extraocular movements intact.     Conjunctiva/sclera: Conjunctivae normal.     Pupils: Pupils are equal, round, and reactive to light.  Neck:     Trachea: Trachea normal.  Cardiovascular:     Rate and Rhythm: Normal rate and regular rhythm.  Pulmonary:     Effort: Pulmonary effort is normal.     Breath sounds: Normal breath sounds and air entry. No stridor or transmitted upper airway sounds. No wheezing.     Comments: Spoke full sentences without difficulty; no cough observed in exam room Abdominal:     General: Abdomen is flat.  Musculoskeletal:        General: Normal range of motion.     Cervical back: Normal range of motion and neck supple.     Right knee: No swelling, deformity, effusion, erythema, ecchymosis, lacerations, bony tenderness or crepitus. Normal range of motion. Tenderness present over the medial joint line. No lateral joint line, MCL, LCL, ACL, PCL or patellar tendon tenderness.     Left knee: No swelling, deformity, effusion, erythema, ecchymosis, lacerations, bony tenderness or crepitus. Normal range of motion. No tenderness. No medial joint line, lateral joint line, MCL, LCL, ACL, PCL or patellar tendon tenderness.     Right lower leg: No swelling, tenderness or bony tenderness. No edema.     Left lower leg: No swelling, tenderness or bony tenderness. No edema.     Right ankle: No swelling.  Left ankle: No swelling.       Legs:     Comments: Crepitus with knee flexion/extension resolved; mildly TTP medial joint line today; wearing neoprene knee sleeve gait sure and steady in clinic ; no limp observed; in/out of chair and on/off exam table without difficulty  Lymphadenopathy:     Head:     Right side of head: No submandibular or preauricular adenopathy.     Left side of head: No submandibular or preauricular adenopathy.     Cervical:     Right cervical: No superficial cervical adenopathy.    Left cervical: No superficial cervical adenopathy.  Skin:    General:  Skin is warm and dry.     Capillary Refill: Capillary refill takes less than 2 seconds.     Coloration: Skin is not ashen, cyanotic, jaundiced, mottled, pale or sallow.     Findings: No abrasion, bruising, burn, erythema, signs of injury, laceration, petechiae, rash or wound.  Neurological:     General: No focal deficit present.     Mental Status: He is alert and oriented to person, place, and time. Mental status is at baseline.     Motor: Motor function is intact. No weakness, tremor, abnormal muscle tone or seizure activity.     Coordination: Coordination is intact. Coordination normal.     Gait: Gait is intact. Gait normal.     Comments: In/out of chair without difficulty; gait sure and steady in clinic; bilateral hand grasp equal 5/5  Psychiatric:        Attention and Perception: Attention and perception normal.        Mood and Affect: Mood and affect normal.        Speech: Speech normal.        Behavior: Behavior normal. Behavior is cooperative.        Thought Content: Thought content normal.        Cognition and Memory: Cognition and memory normal.        Judgment: Judgment normal.           Assessment & Plan:   A-subsequent visit right knee pain medial acute  P-continue neoprene sleeve wear at work.  Add pillow between legs when sleeping at night.  Consider tylenol 1000mg  po QID prn pain; ice 15 minutes QID prn pain/swelling also.  Re-evaluation in 2 weeks schedule with RN Evlyn Kanner; follow up sooner if swelling/worsening pain/locking/popping/giving out.  Patient verbalized understanding information/instructions, agreed with plan of care and had no further questions at this time.

## 2022-09-29 ENCOUNTER — Other Ambulatory Visit (HOSPITAL_BASED_OUTPATIENT_CLINIC_OR_DEPARTMENT_OTHER): Payer: Self-pay

## 2022-09-29 MED ORDER — COVID-19 MRNA 2023-2024 VACCINE (COMIRNATY) 0.3 ML INJECTION
0.3000 mL | Freq: Once | INTRAMUSCULAR | 0 refills | Status: AC
  Filled 2022-09-29: qty 0.3, 1d supply, fill #0

## 2022-10-05 ENCOUNTER — Encounter: Payer: Self-pay | Admitting: Registered Nurse

## 2022-10-05 ENCOUNTER — Ambulatory Visit: Payer: Self-pay | Admitting: Registered Nurse

## 2022-10-05 VITALS — BP 119/79 | HR 74

## 2022-10-05 DIAGNOSIS — M25561 Pain in right knee: Secondary | ICD-10-CM

## 2022-10-05 DIAGNOSIS — M7651 Patellar tendinitis, right knee: Secondary | ICD-10-CM

## 2022-10-05 MED ORDER — DICLOFENAC SODIUM 1 % EX GEL: 4.0000 g | Freq: Four times a day (QID) | CUTANEOUS | 0 refills | Status: AC

## 2022-10-05 NOTE — Progress Notes (Signed)
Subjective:    Patient ID: Jason Swanson, male    DOB: 1956-01-14, 67 y.o.   MRN: [id number]  66y/o african [place] male here for re-evaluation right knee pain last office visit 09/21/21 has plateau'd for pain improvement.  Using neoprene knee sleeve and tylenol/biofreeze/ice prn  Denied giving out/worsening symptoms/weakness/locking. Still noticing some popping.  Performing gastroc and hamstring stretches but not straight leg raises.  Using neoprene knee sleeve at work typically.  Tylenol not helping as much would like to try something else.      Review of Systems  Constitutional:  Negative for chills, diaphoresis and fever.  Cardiovascular:  Negative for leg swelling.  Musculoskeletal:  Positive for arthralgias. Negative for gait problem and joint swelling.  Skin:  Negative for color change, pallor, rash and wound.  Psychiatric/Behavioral:  Negative for agitation, confusion and sleep disturbance.        Objective:   Physical Exam  Constitutional:      General: He is awake. He is not in acute distress.    Appearance: Normal appearance. He is well-developed, well-groomed and overweight. He is not ill-appearing, toxic-appearing or diaphoretic.  HENT:     Head: Normocephalic and atraumatic.     Jaw: There is normal jaw occlusion.     Salivary Glands: Right salivary gland is not diffusely enlarged. Left salivary gland is not diffusely enlarged.     Right Ear: Hearing and external ear normal.     Left Ear: Hearing and external ear normal.     Nose: Nose normal. No congestion or rhinorrhea.     Mouth/Throat:     Lips: Pink. No lesions.     Mouth: Mucous membranes are moist.     Pharynx: Oropharynx is clear.  Eyes:     General: Lids are normal. Vision grossly intact. Gaze aligned appropriately. No scleral icterus.       Right eye: No discharge.        Left eye: No discharge.     Extraocular Movements: Extraocular movements intact.     Conjunctiva/sclera: Conjunctivae normal.      Pupils: Pupils are equal, round, and reactive to light.  Neck:     Trachea: Trachea normal.  Cardiovascular:     Rate and Rhythm: Normal rate and regular rhythm.  Pulmonary:     Effort: Pulmonary effort is normal.     Breath sounds: Normal breath sounds and air entry. No stridor or transmitted upper airway sounds. No wheezing.     Comments: Spoke full sentences without difficulty; no cough observed in exam room Abdominal:     General: Abdomen is flat.  Musculoskeletal:        General: Normal range of motion.     Cervical back: Normal range of motion and neck supple.     Right upper leg: No swelling, edema, deformity, lacerations, tenderness or bony tenderness.     Left upper leg: No swelling, edema, deformity, lacerations, tenderness or bony tenderness.     Right knee: Crepitus and TTP medial joint line present. No swelling, deformity, effusion, erythema, ecchymosis, lacerations or bony tenderness. Normal range of motion. No LCL laxity, MCL laxity, ACL laxity or PCL laxity.     Left knee: No swelling, deformity, effusion, erythema, ecchymosis, lacerations, bony tenderness or crepitus. Normal range of motion. No tenderness. No LCL laxity, MCL laxity, ACL laxity or PCL laxity.Normal alignment.     Right lower leg: No swelling, lacerations or tenderness. No edema.     Left lower leg:  No swelling, lacerations or tenderness. No edema.     Right ankle: No swelling or ecchymosis. No tenderness.     Left ankle: No swelling or ecchymosis. No tenderness.     Comments: Crepitus right knee with flexion/extension audible and palpable; left knee not TTP/arom equal bilaterally; negative valgus/varus stress, lachmann's/anterior/posterior drawer signs  Lymphadenopathy:     Head:     Right side of head: No submandibular or preauricular adenopathy.     Left side of head: No submandibular or preauricular adenopathy.     Cervical:     Right cervical: No superficial cervical adenopathy.    Left cervical:  No superficial cervical adenopathy.  Skin:    General: Skin is warm and dry.     Capillary Refill: Capillary refill takes less than 2 seconds.     Coloration: Skin is not ashen, cyanotic, jaundiced, mottled, pale or sallow.     Findings: No abrasion, bruising, burn, erythema, signs of injury, laceration, petechiae, rash or wound.  Neurological:     General: No focal deficit present.     Mental Status: He is alert and oriented to person, place, and time. Mental status is at baseline.     GCS: GCS eye subscore is 4. GCS verbal subscore is 5. GCS motor subscore is 6.     Cranial Nerves: Cranial nerves 2-12 are intact. No cranial nerve deficit, dysarthria or facial asymmetry.     Motor: Motor function is intact. No weakness, tremor, abnormal muscle tone or seizure activity.     Coordination: Coordination is intact. Coordination normal.     Gait: Gait is intact. Gait normal.     Comments: In/out of chair and on/off exam table without difficulty; gait sure and steady in clinic; bilateral hand grasp equal 5/5  Psychiatric:        Attention and Perception: Attention and perception normal.        Mood and Affect: Mood and affect normal.        Speech: Speech normal.        Behavior: Behavior normal. Behavior is cooperative.        Thought Content: Thought content normal.        Cognition and Memory: Cognition and memory normal.        Judgment: Judgment normal.       Assessment & Plan:  A-patellar tendonitis right subsequent visit   P-Improving still with crepitus but pain improved most with knee sleeve/ice/biofreeze. Tylenol not helping much after work would like to try something else.  Does not want NSAIDS due to on omeprazole already  Electronic Rx diclofenac gel 1% apply 4gm po QID prn pain #50 RF0 discussed trial bid on schedule upon wakeup and bedtime.  Still distal medial patellar tendon with crepitus flexion/extension and palpation.  Did not wear knee sleeve at work today and medial joint  line mildly TTP  Avoid oral NSAIDS advil/aleve/ibuprofen/motrin/naproxen/naprosyn/diclofenac/voltaren while using topical diclofenac.  May still use tylenol 1000mg  po QID prn.  Avoid running/jumping/squats and lunges until symptoms improved. Elevate legs when sitting.  May continue aleve 500mg  po BID take with food. Biofreeze gel topical QID prn.  Tylenol 1000mg  po QID prn pain.  Call or return to clinic as needed if these symptoms worsen or fail to improve as anticipated.  Follow up re-evaluation in 2 weeks or sooner prn worsening.  If patient requires work restrictions will need appt with orthopedics or [facility] provider as restricted by contract in this clinic to write  work restrictions.  Patient to see RN Evlyn Kanner to schedule.  Gentle AROM and wearing knee neoprene sleeve while active.   Do not need to wear knee support while sleeping.  May hand wash knee support.  Do not put in washer or dryer.  May use blow dryer to speed drying.  Cryotherapy 15 minutes QID prn pain/swelling.  Re-evaluation with me in 2 weeks.  Patient verbalized agreement and understanding of treatment plan and had no further questions at this time.       Tylenol not helping will switch to diclofenac gel electronic Rx sent to his pharmacy of choice

## 2022-10-05 NOTE — Patient Instructions (Signed)
Patellar Tendinitis Rehab Ask your health care provider which exercises are safe for you. Do exercises exactly as told by your health care provider and adjust them as directed. It is normal to feel mild stretching, pulling, tightness, or discomfort as you do these exercises. Stop right away if you feel sudden pain or your pain gets worse. Do not begin these exercises until told by your health care provider. Stretching and range-of-motion exercise This exercise warms up your muscles and joints and improves the movement and flexibility of your knee. The exercise also helps to relieve pain and stiffness. Hamstring, doorway stretch This is an exercise in which you lie in a doorway and prop your leg on a wall to stretch the back of your knee and thigh (hamstring). Lie on your back in front of a doorway with your left / right leg resting against the wall and your other leg flat on the floor in the doorway. There should be a slight bend in your left / right knee. Straighten your left / right knee. You should feel a stretch behind your knee or thigh. If you do not, scoot your buttocks closer to the door. Hold this position for __________ seconds. Repeat __________ times. Complete this exercise __________ times a day. Strengthening exercises These exercises build strength and endurance in your knee. Endurance is the ability to use your muscles for a long time, even after they get tired. Quadriceps, isometric This exercise stretches the muscles in front of your thigh (quadriceps) without moving your knee joint (isometric). Lie on your back with your left / right leg extended and your other knee bent. Slowly tense the muscles in the front of your left / right thigh. When you do this, you should see your kneecap slide up toward your hip or see increased dimpling just above the knee. This motion will push the back of your knee toward the floor. If this is painful, try putting a rolled-up hand towel under your  knee to support it in a bent position. Change the size of the towel to find a position that allows you to do this exercise without any pain. For ___5-15_______ seconds, hold the muscle as tight as you can without increasing your pain. Relax the muscles slowly and completely. Repeat ____3______ times. Complete this exercise _____2_____ times a day. Straight leg raises, flexors This exercise stretches the muscles in front of your thigh (quadriceps) and the muscles that move your hips (hip flexors). Lie on your back with your left / right leg extended and your other knee bent. Tense the muscles in the front of your left / right thigh. When you do this, you should see your kneecap slide up or see increased dimpling just above the knee. Keep these muscles tight as you raise your leg 4-6 inches (10-15 cm) off the floor. Do not let your moving knee bend. Hold this position for ___5-15_______ seconds. Keep these muscles tense as you slowly lower your leg. Relax your muscles slowly and completely. Repeat ______3____ times. Complete this exercise _____2_____ times a day. Squats This is a weight-bearing exercise in which you bend your knees and lower your hips while engaging your thigh muscles. Stand in front of a table, with your feet and knees pointing straight ahead. You may rest your hands on the table for balance but not for support. Slowly bend your knees and lower your hips like you are going to sit in a chair. Keep your weight over your heels, not over your toes.  Keep your lower legs upright so they are parallel with the table legs. Do not let your hips go lower than your knees. Do not bend lower than told by your health care provider. If your knee pain increases, do not bend as low. Hold the squat position for ____5-15______ seconds. Slowly push with your legs to return to standing. Do not use your hands to pull yourself to standing. Repeat _____3_____ times. Complete this exercise _____2_____  times a day. Step-downs This is an exercise in which you step down slowly while engaging your leg muscles. Stand on the edge of a step. Keeping your weight over your left / right heel, slowly bend your left / right knee to bring your left / right heel toward the floor. Lower your heel as far as you can while keeping control and without increasing any discomfort. Do not let your left / right knee come forward. Use your leg muscles, not gravity, to lower your body. Hold on to a wall or rail for balance if needed. Slowly push through your heel to lift your body weight back up. Return to the starting position. Repeat ____3______ times. Complete this exercise _______2___ times a day. Straight leg raises, abductors This exercise strengthens the muscles that rotate the leg at the hip and move it away from your body (hip abductors). Lie on your side with your left / right leg in the top position. Lie so your head, shoulder, knee, and hip line up. You may bend your bottom knee to help you keep your balance. Roll your hips slightly forward, so that your hips are stacked directly over each other and your left / right knee is facing forward. Leading with your heel, lift your top leg 4-6 inches (10-15 cm). You should feel the muscles in your outer hip lifting. Do not let your foot drift forward. Do not let your knee roll toward the ceiling. Hold this position for __5-15________ seconds. Slowly lower your leg to the starting position. Let your muscles relax completely after each repetition. Repeat ____3______ times. Complete this exercise _____2_____ times a day. This information is not intended to replace advice given to you by your health care provider. Make sure you discuss any questions you have with your health care provider. Document Revised: 04/05/2021 Document Reviewed: 04/05/2021 Elsevier Patient Education  [place].

## 2022-10-26 ENCOUNTER — Encounter: Payer: Self-pay | Admitting: Internal Medicine

## 2022-11-21 ENCOUNTER — Other Ambulatory Visit: Payer: Self-pay | Admitting: Occupational Medicine

## 2022-11-27 ENCOUNTER — Other Ambulatory Visit: Payer: Self-pay | Admitting: Occupational Medicine

## 2022-11-27 DIAGNOSIS — E559 Vitamin D deficiency, unspecified: Secondary | ICD-10-CM

## 2022-11-27 NOTE — Progress Notes (Signed)
Lab drawn from Left AC tolerated well no issues noted.   

## 2022-11-28 ENCOUNTER — Other Ambulatory Visit: Payer: Self-pay | Admitting: Registered Nurse

## 2022-11-28 ENCOUNTER — Ambulatory Visit: Payer: Self-pay | Admitting: Occupational Medicine

## 2022-11-28 DIAGNOSIS — Z0000 Encounter for general adult medical examination without abnormal findings: Secondary | ICD-10-CM

## 2022-11-28 DIAGNOSIS — Z8639 Personal history of other endocrine, nutritional and metabolic disease: Secondary | ICD-10-CM

## 2022-11-28 DIAGNOSIS — E559 Vitamin D deficiency, unspecified: Secondary | ICD-10-CM

## 2022-11-28 LAB — VITAMIN D 25 HYDROXY (VIT D DEFICIENCY, FRACTURES): Vit D, 25-Hydroxy: 32.5 ng/mL (ref 30.0–100.0)

## 2022-11-28 MED ORDER — CHOLECALCIFEROL 1.25 MG (50000 UT) PO TABS: 1.0000 | ORAL_TABLET | ORAL | 1 refills | Status: DC

## 2022-11-28 MED ORDER — VITAMIN D3 50 MCG (2000 UT) PO CAPS: 2000.0000 [IU] | ORAL_CAPSULE | Freq: Every day | ORAL | 5 refills | Status: AC

## 2022-11-28 NOTE — Progress Notes (Signed)
Your vitamin D level now in the normal range.  NP recommend starting 2000 units cholecalciferol D3 over the counter by mouth daily with meal and during summer to get 15 minutes sunshine on skin.  Next winter will restart the high dose vitamin D until spring again.

## 2022-12-05 ENCOUNTER — Ambulatory Visit: Payer: Self-pay | Admitting: Occupational Medicine

## 2022-12-05 DIAGNOSIS — R7303 Prediabetes: Secondary | ICD-10-CM

## 2022-12-05 NOTE — Progress Notes (Signed)
Lab drawn from Left AC tolerated well no issues noted.     [employer] insurance premium discount evaluation:   Patient completed PCM office visit epic reviewed by RN Evlyn Kanner and transcribed. Labs  Tobacco attestation signed. [facility] ROI formed signed. Forms placed in the chart.   Patient given handouts for [facility] pharmacies and discount drugs list,MyChart, Tele doc setup, Tele doc Behavioral, [facility] counseling and [facility] counseling.  What to do for infectious illness protocol. Given handout for list of medications that can be filled at [facility]. Given Clinic hours and Clinic Email.

## 2022-12-06 LAB — HGB A1C W/O EAG: Hgb A1c MFr Bld: 5.9 % — ABNORMAL HIGH (ref 4.8–5.6)

## 2022-12-08 ENCOUNTER — Ambulatory Visit: Payer: Self-pay | Admitting: Internal Medicine

## 2022-12-28 ENCOUNTER — Telehealth: Payer: Self-pay | Admitting: Registered Nurse

## 2022-12-28 ENCOUNTER — Ambulatory Visit: Payer: Self-pay | Admitting: Occupational Medicine

## 2022-12-28 ENCOUNTER — Encounter: Payer: Self-pay | Admitting: Registered Nurse

## 2022-12-28 VITALS — BP 141/82 | HR 72 | Resp 18

## 2022-12-28 DIAGNOSIS — R12 Heartburn: Secondary | ICD-10-CM

## 2022-12-28 DIAGNOSIS — M7918 Myalgia, other site: Secondary | ICD-10-CM

## 2022-12-28 DIAGNOSIS — R0781 Pleurodynia: Secondary | ICD-10-CM

## 2022-12-28 NOTE — Telephone Encounter (Signed)
Patient to clinic saw RN Bess Kinds reported pain below left breast started last night after lifting box at home.  Denied arm/jaw pain.  VSS with RN Kimrey and TTP intercostal muscle left per RN Kimrey exam.  RN Bess Kinds contacted NP via telephone to discuss case.  A&Ox3 spoke full sentences without dfificulty skin warm dry and pink no swelling or ecchymosis chest full arom arms/legs/back.  May use ibuprofen  po TID take with food OR voltaren gel over affected area/muscle TID but not both at same time.  Pain 8/10 at this time unable to do work non work injury and patient prefers to go home to rest.  May use cryotherapy 15 minutes QID over affected area.  I am unable to write work restrictions in this clinic due to contract limitations patient to follow up with teledoc or PCM if work restrictions needed as not a work injury.  RN Bess Kinds reviewed tel[facility]oc appt scheduling with patient.  Follow up re-evaluation if new or worsening symptoms.  [facility] handouts on heartburn, muscle strain and chest pain sent to patient my chart.  Patient also reported his pantoprazole not working as well as usual for his heartburn recently taking daily and RN Kimrey scheduled him for follow up appt with me next week.

## 2022-12-28 NOTE — Progress Notes (Signed)
Patient reports having right upper chest pain. Worse today picking up things and cutting boxes. Patient reports it started last night after lifting something states wasn't heavy but against his chest. Lung sounds clear.  VVS. Denies NV, neck pain SHOB dizziness. Palpated chest note pain was on the right side of sternum near intercostal rib area only. NP notify. Patient reports unable to work pain is to much 8/10. Patient given Biofreeze, tylenol, and Thermacare to use. Also, patient has Volartren gel at home. Educated to use Voltaren gel to the area and not to take ibuprofen with it. If not improving can use teladoc or go to urgent care.

## 2023-01-02 ENCOUNTER — Encounter: Payer: Self-pay | Admitting: Registered Nurse

## 2023-01-02 ENCOUNTER — Ambulatory Visit: Payer: Self-pay | Admitting: Registered Nurse

## 2023-01-02 VITALS — BP 130/78 | HR 65

## 2023-01-02 DIAGNOSIS — R12 Heartburn: Secondary | ICD-10-CM

## 2023-01-02 MED ORDER — CALCIUM CARBONATE ANTACID 500 MG PO CHEW: 1.0000 | CHEWABLE_TABLET | Freq: Three times a day (TID) | ORAL | 0 refills | Status: AC

## 2023-01-02 MED ORDER — OMEPRAZOLE 20 MG PO CPDR: 20.0000 mg | DELAYED_RELEASE_CAPSULE | Freq: Two times a day (BID) | ORAL | 0 refills | Status: DC

## 2023-01-02 NOTE — Patient Instructions (Signed)
Heartburn Heartburn is a type of pain or discomfort that can happen in the throat or chest. It is often described as a burning pain. It may also cause a bad, acid-like taste in the mouth. Heartburn may feel worse when you lie down or bend over, and it is often worse at night. Heartburn may be caused by stomach contents that move back up into the esophagus (reflux). Follow these instructions at home: Eating and drinking  Avoid certain foods and drinks as told by your health care provider. This may include: Coffee and tea, with or without caffeine. Drinks that contain alcohol. Energy drinks and sports drinks. Carbonated drinks or sodas. Chocolate and cocoa. Peppermint and mint flavorings. Garlic and onions. Horseradish. Spicy and acidic foods, including peppers, chili powder, curry powder, vinegar, hot sauces, and barbecue sauce. Citrus fruit juices and citrus fruits, such as oranges, lemons, and limes. Tomato-based foods, such as red sauce, chili, salsa, and pizza with red sauce. Fried and fatty foods, such as donuts, french fries, potato chips, and high-fat dressings. High-fat meats, such as hot dogs and fatty cuts of red and white meats, such as rib eye steak, sausage, ham, and bacon. High-fat dairy items, such as whole milk, butter, and cream cheese. Eat small, frequent meals instead of large meals. Avoid drinking large amounts of liquid with your meals. Avoid eating meals during the 2-3 hours before bedtime. Avoid lying down right after you eat. Do not exercise right after you eat. Lifestyle     If you are overweight, reduce your weight to an amount that is healthy for you. Ask your health care provider for guidance about a safe weight loss goal. Do not use any products that contain nicotine or tobacco. These products include cigarettes, chewing tobacco, and vaping devices, such as e-cigarettes. These can make symptoms worse. If you need help quitting, ask your health care  provider. Wear loose-fitting clothing. Do not wear anything tight around your waist that causes pressure on your abdomen. Raise (elevate) the head of your bed about 6 inches (15 cm) when you sleep. You can use a wedge to do this. Try to reduce your stress, such as with yoga or meditation. If you need help reducing stress, ask your health care provider. Medicines Take over-the-counter and prescription medicines only as told by your health care provider. Do not take aspirin or NSAIDs, such as ibuprofen, unless your health care provider told you to do so. Stop medicines only as told by your health care provider. If you stop taking some medicines too quickly, your symptoms may get worse. General instructions Pay attention to any changes in your symptoms. Keep all follow-up visits. This is important. Contact a health care provider if: You have new symptoms. You have unexplained weight loss. You have difficulty swallowing, or it hurts to swallow. You have wheezing or a persistent cough. Your symptoms do not improve with treatment. You have frequent heartburn for more than 2 weeks. Get help right away if: You suddenly have pain in your arms, neck, jaw, teeth, or back. You suddenly feel sweaty, dizzy, or light-headed. You have chest pain or shortness of breath. You vomit and your vomit looks like blood or coffee grounds. Your stool is bloody or black. These symptoms may represent a serious problem that is an emergency. Do not wait to see if the symptoms will go away. Get medical help right away. Call your local emergency services (911 in the [place] Do not drive yourself to the hospital. Summary Heartburn   is a type of pain or discomfort that can happen in the throat or chest. It is often described as a burning pain. It may also cause a bad, acid-like taste in the mouth. Avoid certain foods and drinks as told by your health care provider. Take over-the-counter and prescription medicines only as  told by your health care provider. Do not take aspirin or NSAIDs, such as ibuprofen, unless your health care provider told you to do so. Contact a health care provider if your symptoms do not improve or they get worse. This information is not intended to replace advice given to you by your health care provider. Make sure you discuss any questions you have with your health care provider. Document Revised: 03/03/2020 Document Reviewed: 03/03/2020 Elsevier Patient Education  2023 [employer]  

## 2023-01-02 NOTE — Progress Notes (Signed)
Subjective:    Patient ID: Jason Swanson, male    DOB: 02-16-1956, 67 y.o.   MRN: [id number]  67y/o established african [place] male here for evaluation heartburn.  Stated was taking famotidine and tums over the counter but worsening symptoms especially at night.  Uses multiple pillows to prop up head at night for sleep.  Feels burning and reflux in mouth at times denies fever/chills/sore throat/dyspnea/dysphasia/dysphagia/teeth pain/emesis/black stools/diarrhea/abdomen pain.  Epigastric pain/burning.  Has tried pantoprazole BID and omeprazole daily they helped for a while but no longer helping.  Noticed that juice and coffee worsens his symptoms so has decreased/stopped intake.  Patient reported had EGD 4 years ago and was normal.  Patient reported chest/intercostal pain resolved after 3 days.  Stopped pain medication was using topical voltaren worked the best for him when he was having pain.      Review of Systems  Constitutional:  Negative for chills and fever.  HENT:  Negative for dental problem, sore throat, trouble swallowing and voice change.   Respiratory:  Negative for cough, shortness of breath, wheezing and stridor.   Cardiovascular:  Negative for chest pain.  Gastrointestinal:  Negative for abdominal distention, abdominal pain, anal bleeding, blood in stool, diarrhea, nausea and vomiting.  Musculoskeletal:  Negative for gait problem.  Skin:  Negative for rash.  Allergic/Immunologic: Positive for environmental allergies.  Neurological:  Negative for speech difficulty.  Psychiatric/Behavioral:  Positive for sleep disturbance. Negative for agitation and confusion.        Objective:   Physical Exam Vitals and nursing note reviewed.  Constitutional:      General: He is awake. He is not in acute distress.    Appearance: Normal appearance. He is well-developed and well-groomed. He is obese. He is not ill-appearing, toxic-appearing or diaphoretic.  HENT:     Head:  Normocephalic and atraumatic.     Jaw: There is normal jaw occlusion.     Salivary Glands: Right salivary gland is not diffusely enlarged or tender. Left salivary gland is not diffusely enlarged or tender.     Right Ear: Hearing and external ear normal.     Left Ear: Hearing and external ear normal.     Nose: Nose normal. No congestion or rhinorrhea.     Mouth/Throat:     Lips: Pink. No lesions.     Mouth: Mucous membranes are moist. No oral lesions or angioedema.     Dentition: No gingival swelling or gum lesions.     Tongue: No lesions. Tongue does not deviate from midline.     Palate: No mass and lesions.     Pharynx: Uvula midline. Pharyngeal swelling and posterior oropharyngeal erythema present. No oropharyngeal exudate or uvula swelling.     Tonsils: No tonsillar exudate. 0 on the right. 0 on the left.     Comments: Cobblestoning posterior pharynx; bilateral allergic shiners;  Eyes:     General: Lids are normal. Vision grossly intact. Gaze aligned appropriately. Allergic shiner present. No scleral icterus.       Right eye: No discharge.        Left eye: No discharge.     Extraocular Movements: Extraocular movements intact.     Conjunctiva/sclera: Conjunctivae normal.     Pupils: Pupils are equal, round, and reactive to light.  Neck:     Trachea: Trachea and phonation normal. No tracheal deviation.  Cardiovascular:     Rate and Rhythm: Normal rate and regular rhythm.     Pulses: Normal pulses.  Radial pulses are 2+ on the right side and 2+ on the left side.     Heart sounds: Normal heart sounds, S1 normal and S2 normal. No murmur heard.    No friction rub. No gallop.  Pulmonary:     Effort: Pulmonary effort is normal. No respiratory distress.     Breath sounds: Normal breath sounds and air entry. No stridor or transmitted upper airway sounds. No wheezing, rhonchi or rales.     Comments: Spoke full sentences without difficulty; no cough observed in exam room; intermittent  sp02 97-98% RA in exam room Abdominal:     General: Abdomen is flat. Bowel sounds are normal. There is no distension or abdominal bruit. There are no signs of injury.     Palpations: Abdomen is soft. There is no fluid wave, mass or pulsatile mass.     Tenderness: There is no abdominal tenderness. There is no right CVA tenderness, left CVA tenderness, guarding or rebound. Negative signs include Murphy's sign.     Hernia: No hernia is present.     Comments: Dull to percussion x 4 quads; normoactive bowel sounds; standing to sitting to supine and reversed quickly without assist on exam table  Musculoskeletal:        General: Normal range of motion.     Right hand: Normal strength. Normal capillary refill.     Left hand: Normal strength. Normal capillary refill.     Cervical back: Normal range of motion and neck supple. No swelling, edema, deformity, erythema, signs of trauma, lacerations, rigidity, torticollis, tenderness or crepitus. No pain with movement. Normal range of motion.     Thoracic back: No swelling, edema, deformity, signs of trauma, lacerations, spasms or tenderness. Normal range of motion.  Lymphadenopathy:     Head:     Right side of head: No submandibular or preauricular adenopathy.     Left side of head: No submandibular or preauricular adenopathy.     Cervical: No cervical adenopathy.     Right cervical: No superficial cervical adenopathy.    Left cervical: No superficial cervical adenopathy.  Skin:    General: Skin is warm and dry.     Capillary Refill: Capillary refill takes less than 2 seconds.     Coloration: Skin is not ashen, cyanotic, jaundiced, mottled, pale or sallow.     Findings: No abrasion, bruising, burn, erythema, signs of injury, laceration, lesion, petechiae, rash or wound.  Neurological:     General: No focal deficit present.     Mental Status: He is alert and oriented to person, place, and time. Mental status is at baseline.     GCS: GCS eye subscore is  4. GCS verbal subscore is 5. GCS motor subscore is 6.     Cranial Nerves: Cranial nerves 2-12 are intact. No cranial nerve deficit, dysarthria or facial asymmetry.     Sensory: Sensation is intact.     Motor: Motor function is intact. No weakness, tremor, atrophy, abnormal muscle tone or seizure activity.     Coordination: Coordination is intact. Coordination normal.     Gait: Gait is intact. Gait normal.     Comments: In/out of chair and on/off exam table without difficulty; gait sure and steady in clinic; bilateral hand grasp equal 5/5  Psychiatric:        Attention and Perception: Attention and perception normal.        Mood and Affect: Mood and affect normal.        Speech: Speech  normal.        Behavior: Behavior normal. Behavior is cooperative.        Thought Content: Thought content normal.        Cognition and Memory: Cognition and memory normal.        Judgment: Judgment normal.           Assessment & Plan:   A-heartburn  P-normal exam today.  Anti-reflux measures such as raising the head of the bed, avoiding tight clothing or belts, avoiding eating late at night and not lying down shortly after mealtime and achieving weight loss were discussed. Avoid ASA, NSAID's, caffeine, peppermints, alcohol and tobacco. OTC H2 blockers and/or antacids are often very helpful for PRN use.  Will trial omeprazole DR  po BID and tums use prn OTC along with diet modification.  However, for chronic or daily symptoms, prescription strength H2 blockers or a trial of PPI's should be used.  Patient should alert me if there are persistent symptoms, dysphagia, weight loss or GI bleeding (bright red or black). Exitcare handout on heartburn printed for patient.  Follow up with clinic provider or PCM if symptoms persist despite therapy. Patient verbalized agreement and understanding of treatment plan and had no further questions at this time.      P2:  Diet, Food avoidance that aggravate condition, and  Fitness

## 2023-01-02 NOTE — Telephone Encounter (Signed)
Patient seen in clinic 01/02/23 see office note intercostal pain resolved.

## 2023-02-23 NOTE — Telephone Encounter (Signed)
Follow up labs scheduled 05/30/23

## 2023-02-28 ENCOUNTER — Other Ambulatory Visit: Payer: Self-pay | Admitting: Occupational Medicine

## 2023-03-26 IMAGING — CT CT HEAD W/O CM
4 series · 17 of 47 positions shown, 19 images · non-contrast
Comparison: None.

CLINICAL DATA: Headache.

EXAM:
CT HEAD WITHOUT CONTRAST
TECHNIQUE: Contiguous axial images were obtained from the base of the skull
through the vertex without intravenous contrast.

[Series 2: head wo · axial · 0.45mm/px · z∈[-237,-122]mm · 7 of 31 slices shown, 9 images]
[im 4/31  brain]
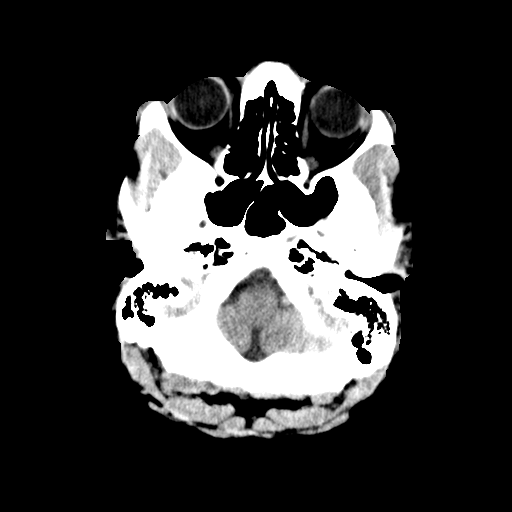
[im 4/31  bone]
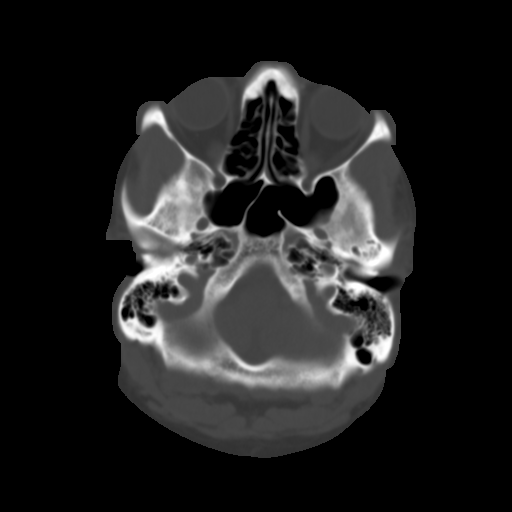
[im 8/31  brain]
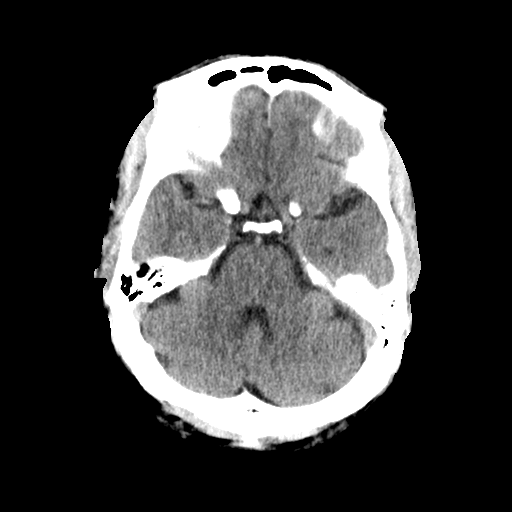
[im 12/31  brain]
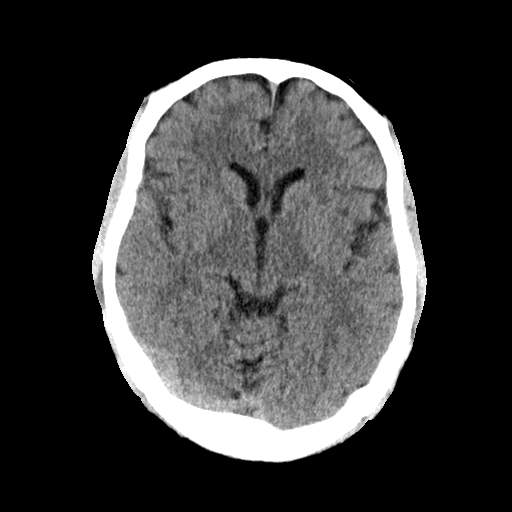
[im 16/31  brain]
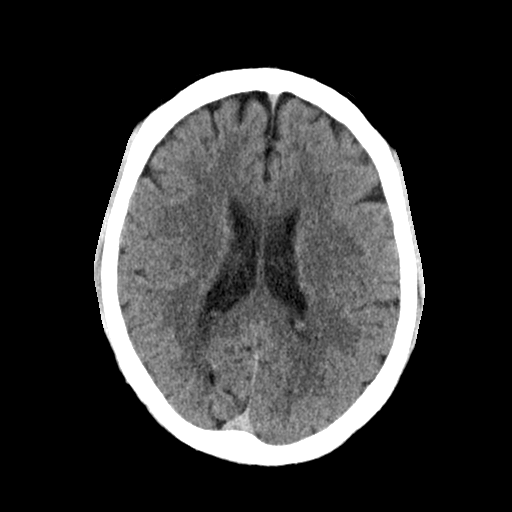
[im 19/31  brain]
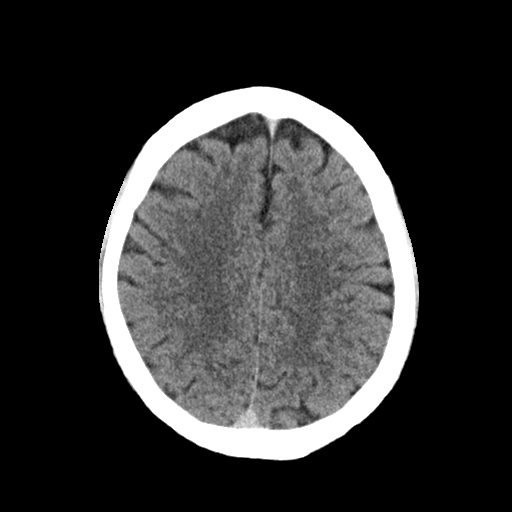
[im 19/31  bone]
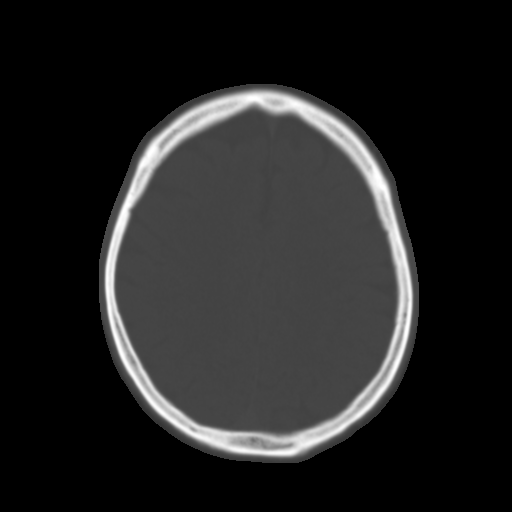
[im 23/31  brain]
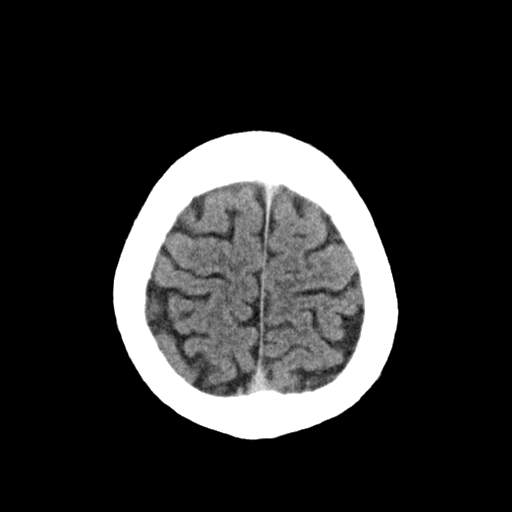
[im 27/31  brain]
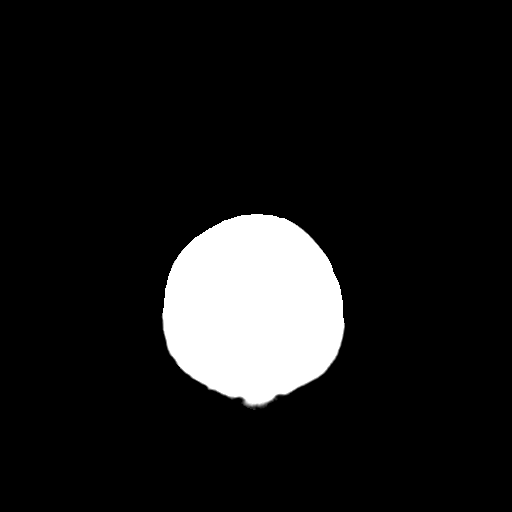

[Series 3: head bone · axial · 0.45mm/px · z∈[-238,-184]mm · 4 of 77 slices shown]
[im 8/77  bone]
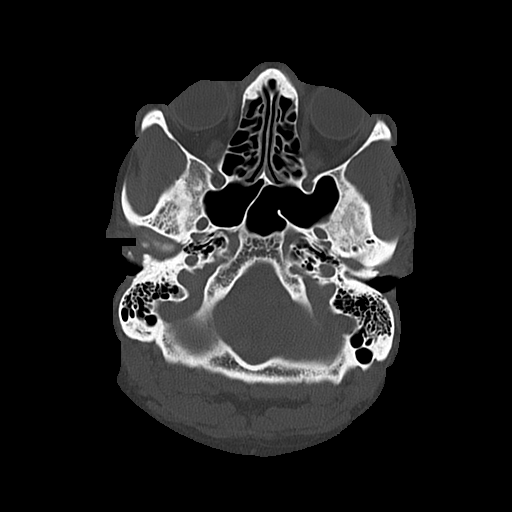
[im 16/77  bone]
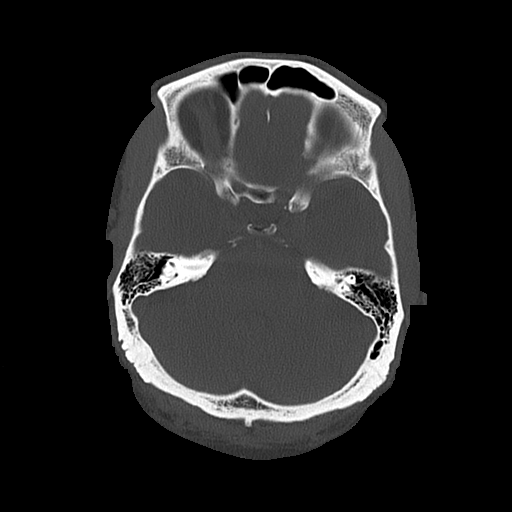
[im 23/77  bone]
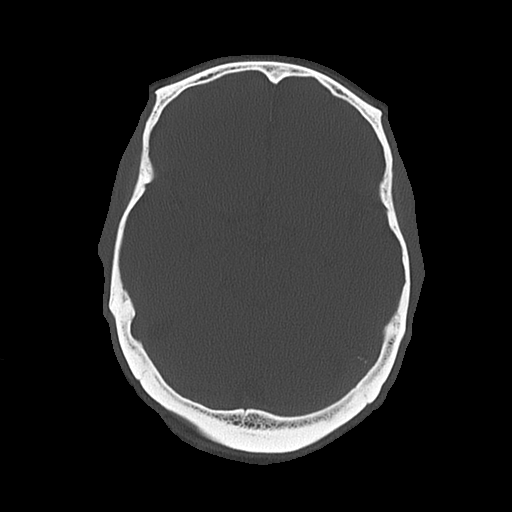
[im 35/77  bone]
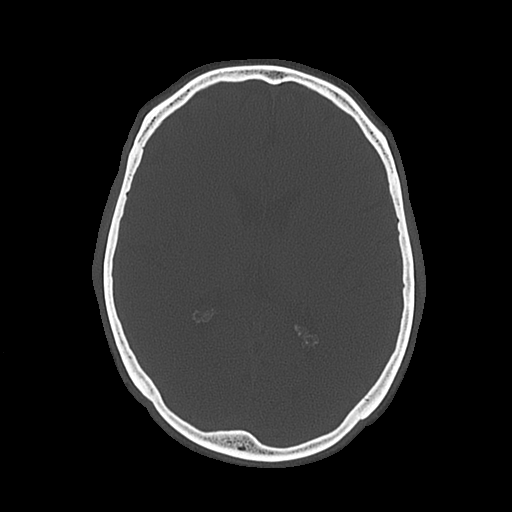

[Series 4: coronal soft · coronal · 0.33mm/px · 3 of 64 slices shown]
[im 22/64  brain]
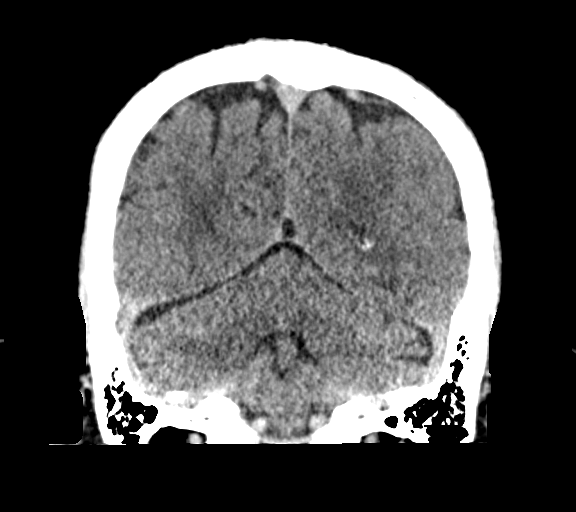
[im 29/64  brain]
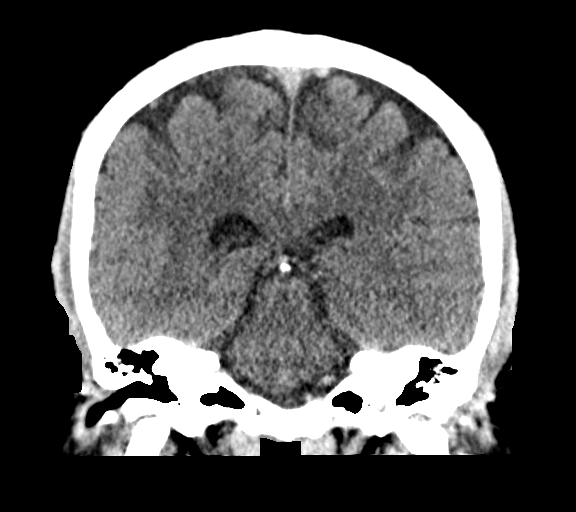
[im 36/64  brain]
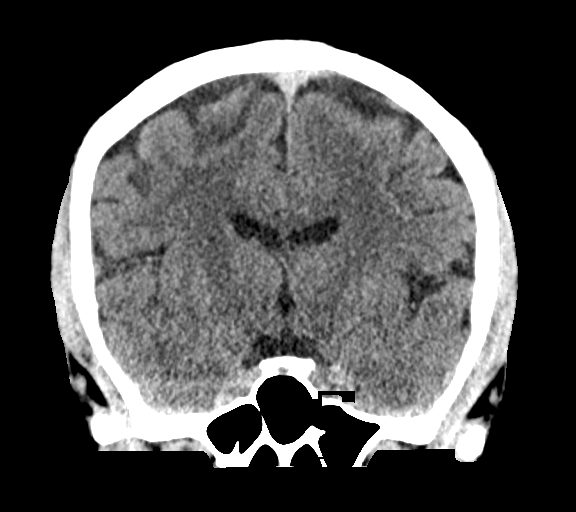

[Series 5: sagittal soft · sagittal · 0.32mm/px · 3 of 56 slices shown]
[im 19/56  brain]
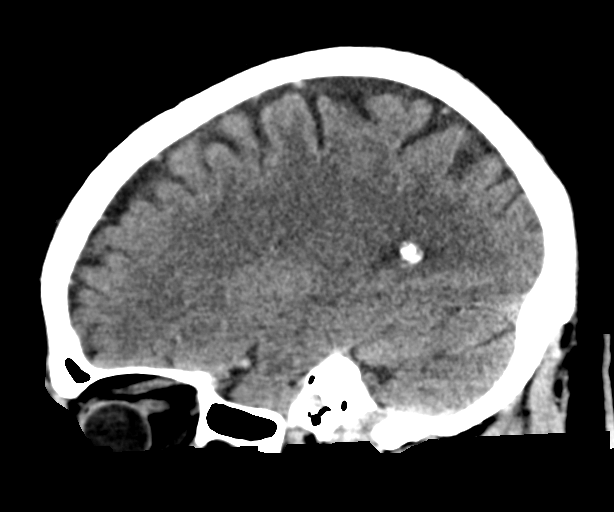
[im 28/56  brain]
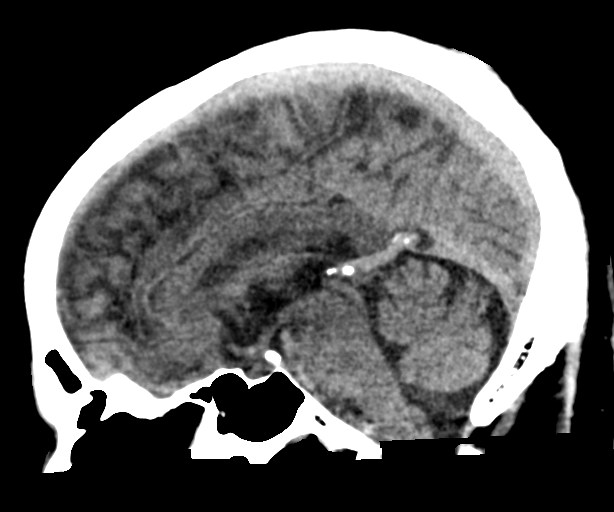
[im 37/56  brain]
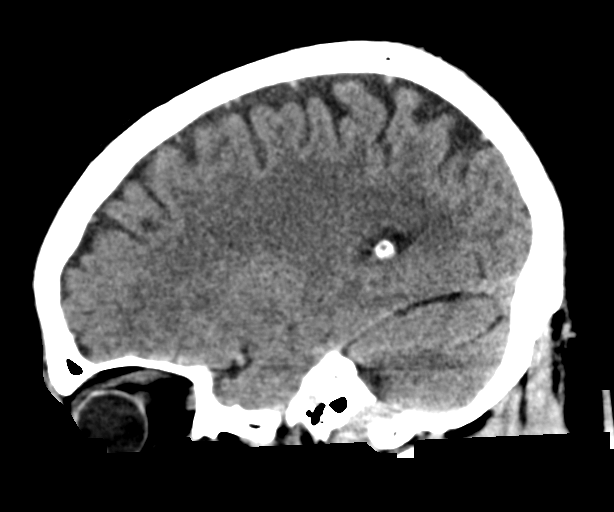

[17 of 47 positions shown; findings below may reference images not displayed]

FINDINGS: Brain: There is no evidence for acute hemorrhage, hydrocephalus,
mass lesion, or abnormal extra-axial fluid collection. No definite
CT evidence for acute infarction.

Vascular: No hyperdense vessel or unexpected calcification.

Skull: No evidence for fracture. No worrisome lytic or sclerotic
lesion.

Sinuses/Orbits: The visualized paranasal sinuses and mastoid air
cells are clear. Visualized portions of the globes and intraorbital
fat are unremarkable.

Other: None.
IMPRESSION: No acute intracranial abnormality.

## 2023-04-08 ENCOUNTER — Other Ambulatory Visit: Payer: Self-pay | Admitting: Registered Nurse

## 2023-04-08 DIAGNOSIS — Z8639 Personal history of other endocrine, nutritional and metabolic disease: Secondary | ICD-10-CM

## 2023-04-08 NOTE — Progress Notes (Signed)
Epic reviewed patient has appt with PCM scheduled 05/21/23 and read receipt from epic for results note 12/28/22  Annual labs due Dec 2024.

## 2023-04-08 NOTE — Progress Notes (Signed)
Patient reviewed results 12/06/22 and had scheduled lab appt.  My chart message sent to patient Jason Swanson, I have received new national guidelines regarding vitamin D.  We will not be drawing any further vitamin D levels at [facility]. Continue your 2000 units vitamin D by mouth daily with meal over the counter. RN Bess Kinds will be cancelling your 05/30/23 appt for lab.  Keep your appt with [facility] 05/21/23.    Please let us know if you have further questions.  Sincerely,  Albina Billet NP-C

## 2023-05-21 ENCOUNTER — Ambulatory Visit: Payer: Self-pay | Admitting: Emergency Medicine

## 2023-05-30 ENCOUNTER — Other Ambulatory Visit: Payer: Self-pay

## 2023-05-31 ENCOUNTER — Ambulatory Visit: Payer: Self-pay | Admitting: Registered Nurse

## 2023-05-31 ENCOUNTER — Telehealth: Payer: Self-pay | Admitting: Registered Nurse

## 2023-05-31 VITALS — BP 135/81 | HR 80 | Temp 98.7°F | Resp 16 | Ht 63.0 in | Wt 193.0 lb

## 2023-05-31 DIAGNOSIS — R7303 Prediabetes: Secondary | ICD-10-CM

## 2023-05-31 DIAGNOSIS — G8929 Other chronic pain: Secondary | ICD-10-CM

## 2023-05-31 DIAGNOSIS — J00 Acute nasopharyngitis [common cold]: Secondary | ICD-10-CM

## 2023-05-31 DIAGNOSIS — H547 Unspecified visual loss: Secondary | ICD-10-CM

## 2023-05-31 DIAGNOSIS — Z0000 Encounter for general adult medical examination without abnormal findings: Secondary | ICD-10-CM

## 2023-05-31 DIAGNOSIS — H6993 Unspecified Eustachian tube disorder, bilateral: Secondary | ICD-10-CM

## 2023-05-31 LAB — GLUCOSE, POCT (MANUAL RESULT ENTRY): POC Glucose: 127 mg/dl — AB (ref 70–99)

## 2023-05-31 NOTE — Telephone Encounter (Signed)
Hgba1c has been steady.  5.9/6 will delay repeat until Dec 2024 when annual labs due male exec panel and Hgba1c for 2026 Be Well.  Patient no showed Hgba1c repeat with RN Burna Mortimer yesterday asked RN to contact patient to reschedule appt.  Will check random poct glucose/BP and weight with patient and validate if having any symptoms hypo/hyperglycemia polyuria/polyphagia/polydipsia/sweats/tremors/headache on recurrent basis.

## 2023-05-31 NOTE — Patient Instructions (Addendum)
Form - Headache Record There are many types and causes of headaches. A headache record can help guide your treatment plan. Use this form to record the details. Bring this form with you to your follow-up visits. Follow your health care provider's instructions on how to describe your headache. You may be asked to: Use a pain scale. This is a tool to rate the intensity of your headache using words or numbers. Describe what your headache feels like, such as dull, achy, throbbing, or sharp. Headache record Date: _______________ Time (from start to end): ____________________ Location of the headache: _________________________ Intensity of the headache: ____________________ Description of the headache: ______________________________________________________________ Hours of sleep the night before the headache: __________ Food or drinks before the headache started: ______________________________________________________________________________________ Events before the headache started: _______________________________________________________________________________________________ Symptoms before the headache started: __________________________________________________________________________________________ Symptoms during the headache: __________________________________________________________________________________________________ Treatment: ________________________________________________________________________________________________________________ Effect of treatment: _________________________________________________________________________________________________________ Other comments: ___________________________________________________________________________________________________________ Date: _______________ Time (from start to end): ____________________ Location of the headache: _________________________ Intensity of the headache: ____________________ Description of the headache:  ______________________________________________________________ Hours of sleep the night before the headache: __________ Food or drinks before the headache started: ______________________________________________________________________________________ Events before the headache started: ____________________________________________________________________________________________ Symptoms before the headache started: _________________________________________________________________________________________ Symptoms during the headache: _______________________________________________________________________________________________ Treatment: ________________________________________________________________________________________________________________ Effect of treatment: _________________________________________________________________________________________________________ Other comments: ___________________________________________________________________________________________________________ Date: _______________ Time (from start to end): ____________________ Location of the headache: _________________________ Intensity of the headache: ____________________ Description of the headache: ______________________________________________________________ Hours of sleep the night before the headache: __________ Food or drinks before the headache started: ______________________________________________________________________________________ Events before the headache started: ____________________________________________________________________________________________ Symptoms before the headache started: _________________________________________________________________________________________ Symptoms during the headache: _______________________________________________________________________________________________ Treatment:  ________________________________________________________________________________________________________________ Effect of treatment: _________________________________________________________________________________________________________ Other comments: ___________________________________________________________________________________________________________ Date: _______________ Time (from start to end): ____________________ Location of the headache: _________________________ Intensity of the headache: ____________________ Description of the headache: ______________________________________________________________ Hours of sleep the night before the headache: _________ Food or drinks before the headache started: ______________________________________________________________________________________ Events before the headache started: ____________________________________________________________________________________________ Symptoms before the headache started: _________________________________________________________________________________________ Symptoms during the headache: _______________________________________________________________________________________________ Treatment: ________________________________________________________________________________________________________________ Effect of treatment: _________________________________________________________________________________________________________ Other comments: ___________________________________________________________________________________________________________ Date: _______________ Time (from start to end): ____________________ Location of the headache: _________________________ Intensity of the headache: ____________________ Description of the headache: ______________________________________________________________ Hours of sleep the night before the headache: _________ Food or drinks before the headache started:  ______________________________________________________________________________________ Events before the headache started: ____________________________________________________________________________________________ Symptoms before the headache started: _________________________________________________________________________________________ Symptoms during the headache: _______________________________________________________________________________________________ Treatment: ________________________________________________________________________________________________________________ Effect of treatment: _________________________________________________________________________________________________________ Other comments: ___________________________________________________________________________________________________________ This information is not intended to replace advice given to you by your health care provider. Make sure you discuss any questions you have with your health care provider. Document Revised: 01/26/2021 Document Reviewed: 01/26/2021 Elsevier Patient Education  2024 [employer]  Presbyopia  When you have presbyopia, your eyes have trouble focusing on objects that are close to you, or they cannot focus on objects that are close to you. The ability to focus the eyes from near to far is called accommodation. Loss of accommodation is part of the normal aging process. Presbyopia usually becomes noticeable around age 43 and gets worse (progresses) with age. What are the causes? This condition is caused by a loss of flexibility in the natural lens in the eye. This usually happens with age. What are the signs or symptoms? The main symptom of this condition is difficulty reading or seeing things that are close to the face. In early presbyopia, holding objects a little farther from the eyes helps you see more clearly. Over time, holding things farther will  not help enough. Other  symptoms may include: Headaches. Eye aches or strain when reading or focusing up close. How is this diagnosed? This condition is diagnosed based on your medical history and an eye exam done by an eye specialist (optometrist or ophthalmologist). You will take a vision test in which a series of lenses are held in front of your eye and you read an eye chart. How is this treated? Treatment may involve wearing glasses or contact lenses, or updating your prescription if you already wear glasses or contact lenses. You may require bifocals (with a line in the glasses lens), progressive glasses ("blended bifocals" with no line in the glasses lens) or bifocal contacts. In some cases, you may be able to have surgery to change the shape of the cornea (refractive surgery) to allow you to see more clearly up close. Follow these instructions at home: If you are prescribed glasses or contact lenses, wear them as told by your health care provider. It can take up to 2 weeks for your eyes to adjust to new prescription lenses. Do not drive or use machinery if your vision is blurry. You may need to wear prescription glasses or contact lenses to correct your vision whenever you drive or use machinery. Discuss any concerns with your health care provider. Take over-the-counter and prescription medicines only as told by your health care provider. This includes the use of eye drops. Get an eye exam at least once a year. Keep all follow-up visits. This is important. Contact a health care provider if: Your symptoms get worse. You develop new symptoms. Your glasses or contacts do not help, or you still do not see better when wearing them. Get help right away if: You have severe redness, pain, or swelling around your eye. You have vision changes or trouble seeing. Summary Presbyopia is when your eyes have trouble focusing on objects that are close to you. It is usually due to age. If left untreated, presbyopia may cause  eye aches or strain when reading or focusing up close. Treatment may include glasses, contact lenses, or, rarely, surgery. This information is not intended to replace advice given to you by your health care provider. Make sure you discuss any questions you have with your health care provider. Document Revised: 02/16/2020 Document Reviewed: 02/16/2020 Elsevier Patient Education  2024 [employer] Inc. Migraine Headache A migraine headache is an intense pulsing or throbbing pain on one or both sides of the head. Migraine headaches may also cause other symptoms, such as nausea, vomiting, and sensitivity to light and noise. A migraine headache can last from 4 hours to 3 days. Talk with your health care provider about what things may bring on (trigger) your migraine headaches. What are the causes? The exact cause is not known. However, a migraine may be caused when nerves in the brain get irritated and release chemicals that cause blood vessels to become inflamed. This inflammation causes pain. Migraines may be triggered or caused by: Smoking. Medicines, such as: Nitroglycerin, which is used to treat chest pain. Birth control pills. Estrogen. Certain blood pressure medicines. Foods or drinks that contain nitrates, glutamate, aspartame, MSG, or tyramine. Certain foods or drinks, such as aged cheeses, chocolate, alcohol, or caffeine. Doing physical activity that is very hard. Other triggers may include: Menstruation. Pregnancy. Hunger. Stress. Getting too much or too little sleep. Weather changes. Tiredness (fatigue). What increases the risk? The following factors may make you more likely to have migraine headaches: Being between the ages of  19-56 years old. Being male. Having a family history of migraine headaches. Being Caucasian. Having a mental health condition, such as depression or anxiety. Being obese. What are the signs or symptoms? The main symptom of this condition is pulsing or  throbbing pain. This pain may: Happen in any area of the head, such as on one or both sides. Make it hard to do daily activities. Get worse with physical activity. Get worse around bright lights, loud noises, or smells. Other symptoms may include: Nausea. Vomiting. Dizziness. Before a migraine headache starts, you may get warning signs (an aura). An aura may include: Seeing flashing lights or having blind spots. Seeing bright spots, halos, or zigzag lines. Having tunnel vision or blurred vision. Having numbness or a tingling feeling. Having trouble talking. Having muscle weakness. After a migraine ends, you may have symptoms. These may include: Feeling tired. Trouble concentrating. How is this diagnosed? A migraine headache can be diagnosed based on: Your symptoms. A physical exam. Tests, such as: A CT scan or an MRI of the head. These tests can help rule out other causes of headaches. Taking fluid from the spine (lumbar puncture) to examine it (cerebrospinal fluid analysis, or CSF analysis). How is this treated? This condition may be treated with medicines that: Relieve pain and nausea. Prevent migraines. Treatment may also include: Acupuncture. Lifestyle changes like avoiding foods that trigger migraine headaches. Learning ways to control your body (biofeedback). Talk therapy to help you know and deal with negative thoughts (cognitive behavioral therapy). Follow these instructions at home: Medicines Take over-the-counter and prescription medicines only as told by your provider. Ask your provider if the medicine prescribed to you: Requires you to avoid driving or using machinery. Can cause constipation. You may need to take these actions to prevent or treat constipation: Drink enough fluid to keep your pee (urine) pale yellow. Take over-the-counter or prescription medicines. Eat foods that are high in fiber, such as beans, whole grains, and fresh fruits and  vegetables. Limit foods that are high in fat and processed sugars, such as fried or sweet foods. Lifestyle  Do not drink alcohol. Do not use any products that contain nicotine or tobacco. These products include cigarettes, chewing tobacco, and vaping devices, such as e-cigarettes. If you need help quitting, ask your provider. Get 7-9 hours of sleep each night, or the amount recommended by your provider. Find ways to manage stress, such as meditation, deep breathing, or yoga. Try to exercise regularly. This can help lessen how bad and how often your migraines occur. General instructions Keep a journal to find out what triggers your migraines, so you can avoid those things. For example, write down: What you eat and drink. How much sleep you get. Any change to your diet or medicines. If you have a migraine headache: Avoid things that make your symptoms worse, such as bright lights. Lie down in a dark, quiet room. Do not drive or use machinery. Ask your provider what activities are safe for you while you have symptoms. Keep all follow-up visits. Your provider will monitor your symptoms and recommend any further treatment. Where to find more information Coalition for Headache and Migraine Patients (CHAMP): headachemigraine.org American Migraine Foundation: americanmigrainefoundation.org National Headache Foundation: headaches.org Contact a health care provider if: You have symptoms that are different or worse than your usual migraine headache symptoms. You have more than 15 days of headaches in one month. Get help right away if: Your migraine headache becomes severe or lasts more than 72  hours. You have a fever or stiff neck. You have vision loss. Your muscles feel weak or like you cannot control them. You lose your balance often or have trouble walking. You faint. You have a seizure. This information is not intended to replace advice given to you by your health care provider. Make sure  you discuss any questions you have with your health care provider. Document Revised: 04/24/2022 Document Reviewed: 04/24/2022 Elsevier Patient Education  2024 [employer].

## 2023-06-10 NOTE — Telephone Encounter (Signed)
Patient reported feeling well stated never started metformin from [facility] didn't want to take it.  Modified his diet.  See office visit note 05/31/2023

## 2023-09-27 ENCOUNTER — Other Ambulatory Visit: Payer: Self-pay

## 2023-09-27 ENCOUNTER — Other Ambulatory Visit: Payer: Self-pay | Admitting: Registered Nurse

## 2023-09-27 NOTE — Progress Notes (Signed)
EE in clinic this morning to have fasting labs drawn.  Blood drawn from left wrist area and procedure tolerated well by EE.  Reece Packer, RN, BSN, MPH, COHN-S

## 2023-09-28 ENCOUNTER — Telehealth: Payer: Self-pay | Admitting: Registered Nurse

## 2023-09-28 ENCOUNTER — Encounter: Payer: Self-pay | Admitting: Registered Nurse

## 2023-09-28 DIAGNOSIS — R718 Other abnormality of red blood cells: Secondary | ICD-10-CM

## 2023-09-28 DIAGNOSIS — R7303 Prediabetes: Secondary | ICD-10-CM | POA: Insufficient documentation

## 2023-09-28 DIAGNOSIS — E785 Hyperlipidemia, unspecified: Secondary | ICD-10-CM

## 2023-09-28 LAB — CMP12+LP+TP+TSH+6AC+PSA+CBC…
ALT: 30 [IU]/L (ref 0–44)
AST: 22 [IU]/L (ref 0–40)
Albumin: 4.6 g/dL (ref 3.9–4.9)
Alkaline Phosphatase: 81 [IU]/L (ref 44–121)
BUN/Creatinine Ratio: 15 (ref 10–24)
BUN: 19 mg/dL (ref 8–27)
Basophils Absolute: 0 10*3/uL (ref 0.0–0.2)
Basos: 1 %
Bilirubin Total: 1.1 mg/dL (ref 0.0–1.2)
Calcium: 9.5 mg/dL (ref 8.6–10.2)
Chloride: 102 mmol/L (ref 96–106)
Chol/HDL Ratio: 4.4 {ratio} (ref 0.0–5.0)
Cholesterol, Total: 176 mg/dL (ref 100–199)
Creatinine, Ser: 1.25 mg/dL (ref 0.76–1.27)
EOS (ABSOLUTE): 0.1 10*3/uL (ref 0.0–0.4)
Eos: 1 %
Estimated CHD Risk: 0.9 times avg. (ref 0.0–1.0)
Free Thyroxine Index: 1.7 (ref 1.2–4.9)
GGT: 23 [IU]/L (ref 0–65)
Globulin, Total: 2.9 g/dL (ref 1.5–4.5)
Glucose: 103 mg/dL — ABNORMAL HIGH (ref 70–99)
HDL: 40 mg/dL (ref >39)
Hematocrit: 53.7 % — ABNORMAL HIGH (ref 37.5–51.0)
Hemoglobin: 17.7 g/dL (ref 13.0–17.7)
Immature Grans (Abs): 0 10*3/uL (ref 0.0–0.1)
Immature Granulocytes: 1 %
Iron: 109 ug/dL (ref 38–169)
LDH: 167 [IU]/L (ref 121–224)
LDL Chol Calc (NIH): 110 mg/dL — ABNORMAL HIGH (ref 0–99)
Lymphocytes Absolute: 2.3 10*3/uL (ref 0.7–3.1)
Lymphs: 36 %
MCH: 30.2 pg (ref 26.6–33.0)
MCHC: 33 g/dL (ref 31.5–35.7)
MCV: 92 fL (ref 79–97)
Monocytes Absolute: 0.4 10*3/uL (ref 0.1–0.9)
Monocytes: 7 %
Neutrophils Absolute: 3.7 10*3/uL (ref 1.4–7.0)
Neutrophils: 54 %
Phosphorus: 2.8 mg/dL (ref 2.8–4.1)
Platelets: 208 10*3/uL (ref 150–450)
Potassium: 4.1 mmol/L (ref 3.5–5.2)
Prostate Specific Ag, Serum: 0.5 ng/mL (ref 0.0–4.0)
RBC: 5.87 x10E6/uL — ABNORMAL HIGH (ref 4.14–5.80)
RDW: 12.9 % (ref 11.6–15.4)
Sodium: 141 mmol/L (ref 134–144)
T3 Uptake Ratio: 29 % (ref 24–39)
T4, Total: 5.7 ug/dL (ref 4.5–12.0)
TSH: 0.85 u[IU]/mL (ref 0.450–4.500)
Total Protein: 7.5 g/dL (ref 6.0–8.5)
Triglycerides: 146 mg/dL (ref 0–149)
Uric Acid: 7.2 mg/dL (ref 3.8–8.4)
VLDL Cholesterol Cal: 26 mg/dL (ref 5–40)
WBC: 6.6 10*3/uL (ref 3.4–10.8)
eGFR: 64 mL/min/{1.73_m2} (ref >59)

## 2023-09-28 LAB — HEMOGLOBIN A1C
Est. average glucose Bld gHb Est-mCnc: 120 mg/dL
Hgb A1c MFr Bld: 5.8 % — ABNORMAL HIGH (ref 4.8–5.6)

## 2023-09-28 LAB — SPECIMEN STATUS REPORT

## 2023-09-28 NOTE — Telephone Encounter (Signed)
Results copied from [facility] portal did not cross over into epic  Jason Swanson,  Your 3 month blood sugar (Hgba1c), LDL, spot glucose, red blood cells/hematocrit were elevated  Hgba1c slghtly improved.  Cholesterol greatly improved from previous.  Your red blood cells have been elevated in the past also.   I recommend routine follow up with PCM.  Appt with [facility] dietitian which is free link to schedule [name] ([email or web address]).  Avoid dehydration drink water to keep urine pale yellow clear and voiding every 2-4 hours while awake.  I recommend 5-10lb weight loss and 150 minutes activity per week.  I recommend activity after eating meals/snacks.  Keep added sugars in diet daily less than 7 teaspoons/150 calories per day.  I recommend 30 grams fiber per day in diet.  Next labs recommended in 6 to 12 months.  You met requirements for [employer] Well 2026 insurance discount [employer] will be sending out paperwork in February please complete and bring to clinic.  Your liver/kidney/thyroid function tests normal, prostate, iron, electrolytes were normal.  No infection noted on complete blood count.  Please come to clinic if you want printed copy of results next week.  Please let me know if you have further questions or concerns.   Jason Billet NP-C  Chemistries  Glucose  103 High mg/dL 70 - 99 Uric Acid  7.2                           Therapeutic target for gout patients: <6.0 mg/dL 3.8 - 8.4 BUN  19 mg/dL 8 - 27 Creatinine  4.09 mg/dL 8.11 - 9.14 eGFR  64 NW/GNF/6.21  >59 BUN/Creatinine Ratio  15 10 - 24 Sodium  141 mmol/L 134 - 144 Potassium  4.1 mmol/L 3.5 - 5.2 Chloride  102 mmol/L 96 - 106 Calcium  9.5 mg/dL 8.6 - 30.8 Phosphorus  2.8 mg/dL 2.8 - 4.1 Protein, Total  7.5 g/dL 6.0 - 8.5 Albumin  4.6 g/dL 3.9 - 4.9 Globulin, Total  2.9 g/dL 1.5 - 4.5 Bilirubin, Total  1.1 mg/dL 0.0 - 1.2 Alkaline Phosphatase  81 IU/L 44 - 121 LDH  167 IU/L 121 - 224 AST (SGOT)  22 IU/L 0 -  40 ALT (SGPT)  30 IU/L 0 - 44 GGT  23 IU/L 0 - 65 Iron  109 ug/dL 38 - 657 .  Lipids  Cholesterol, Total  176 mg/dL 846 - 962 Triglycerides  146 mg/dL 0 - 952 HDL Cholesterol  40 mg/dL  >84 VLDL Cholesterol Cal  26 mg/dL 5 - 40 LDL Chol Calc (NIH)  110 High mg/dL 0 - 99 T. Chol/HDL Ratio  4.4 ratio 0.0 - 5.0 Please Note:                                                   T. Chol/HDL Ratio                                                            Men  Women  1/2 Avg.Risk  3.4    3.3                                                  Avg.Risk  5.0    4.4                                               2X Avg.Risk  9.6    7.1                                               3X Avg.Risk 23.4   11.0 Estimated CHD Risk  0.9                The CHD Risk is based on the T. Chol/HDL ratio. Other                factors affect CHD Risk such as hypertension, smoking,                diabetes, severe obesity, and family history of                premature CHD. times avg. 0.0 - 1.0 .  Thyroid  TSH  0.850 uIU/mL 0.450 - 4.500 Thyroxine (T4)  5.7 ug/dL 4.5 - 28.4 T3 Uptake  29 % 24 - 39 Free Thyroxine Index  1.7 1.2 - 4.9 .  Immunoassay  Prostate Specific Ag  0.5 Roche ECLIA methodology.                                                                      . According to the American Urological Association, Serum PSA should decrease and remain at undetectable levels after radical prostatectomy. The AUA defines biochemical recurrence as an initial PSA value 0.2 ng/mL or greater followed by a subsequent confirmatory PSA value 0.2 ng/mL or greater. Values obtained with different assay methods or kits cannot be used interchangeably. Results cannot be interpreted as absolute evidence of the presence or absence of malignant disease. ng/mL 0.0 - 4.0 .  CBC, Platelet Ct, and Diff  WBC  6.6 x10E3/uL 3.4 -  10.8 RBC  5.87 High x10E6/uL 4.14 - 5.80 Hemoglobin  17.7 g/dL 13.2 - 44.0 Hematocrit  53.7 High % 37.5 - 51.0 MCV  92 fL 79 - 97 MCH  30.2 pg 26.6 - 33.0 MCHC  33.0 g/dL 10.2 - 72.5 RDW  36.6 % 11.6 - 15.4 Platelets  208 x10E3/uL 150 - 450 Neutrophils  54 %  Not Estab. Lymphs  36 %  Not Estab. Monocytes  7 %  Not Estab. Eos  1 %  Not Estab. Basos  1 %  Not Estab. Neutrophils (Absolute)  3.7 x10E3/uL 1.4 - 7.0 Lymphs (Absolute)  2.3 x10E3/uL 0.7 - 3.1 Monocytes(Absolute)  0.4 x10E3/uL 0.1 - 0.9 Eos (Absolute)  0.1 x10E3/uL 0.0 - 0.4 Baso (  Absolute)  0.0 x10E3/uL 0.0 - 0.2 Immature Granulocytes  1 %  Not Estab. Immature Grans (Abs)  0.0 x10E3/uL 0.0 - 0.1 Hgb A1c with eAG Estimation (#[id number]) Test Results Flag Units Reference Interval Hemoglobin A1c  5.8 High % 4.8 - 5.6 Please Note:                                                         .          Prediabetes: 5.7 - 6.4          Diabetes: >6.4          Glycemic control for adults with diabetes: <7.0 Estim. Avg Glu (eAG)  120 mg/dL

## 2023-10-04 ENCOUNTER — Ambulatory Visit: Payer: Self-pay | Admitting: Registered Nurse

## 2023-10-04 ENCOUNTER — Encounter: Payer: Self-pay | Admitting: Registered Nurse

## 2023-10-04 DIAGNOSIS — E785 Hyperlipidemia, unspecified: Secondary | ICD-10-CM

## 2023-10-04 MED ORDER — ATORVASTATIN CALCIUM 40 MG PO TABS: 40.0000 mg | ORAL_TABLET | Freq: Every day | ORAL | 3 refills | Status: AC

## 2023-10-04 NOTE — Telephone Encounter (Signed)
Discussed results in detail with patient in clinic today and refilled from [facility] his atorvastatin 40mg  po daily #90.  Patient still does not want to take metformin and plans to continue activity and attempt weight loss/diet modification.  Hgba1c improved slightly from previous.  LFTs stable and lipids improved on atorvastatin.

## 2023-10-04 NOTE — Patient Instructions (Signed)

## 2023-10-04 NOTE — Progress Notes (Signed)
Subjective:    Patient ID: Jason Swanson, male    DOB: 08-30-1956, 68 y.o.   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  HENT:  Negative for trouble swallowing and voice change.   Eyes:  Negative for photophobia and visual disturbance.  Respiratory:  Negative for cough.   Cardiovascular:  Negative for chest pain.  Gastrointestinal:  Negative for abdominal pain, diarrhea, nausea and vomiting.  Endocrine: Negative for polydipsia, polyphagia and polyuria.  Genitourinary:  Negative for difficulty urinating.  Musculoskeletal:  Negative for gait problem.  Skin:  Negative for rash.  Psychiatric/Behavioral:  Negative for agitation and confusion.        Objective:   Physical Exam Vitals and nursing note reviewed.  Constitutional:      General: He is awake. He is not in acute distress.    Appearance: Normal appearance. He is well-developed, well-groomed and overweight. He is not ill-appearing, toxic-appearing or diaphoretic.  HENT:     Head: Normocephalic and atraumatic.     Jaw: There is normal jaw occlusion.     Salivary Glands: Right salivary gland is not diffusely enlarged. Left salivary gland is not diffusely enlarged.     Right Ear: Hearing and external ear normal.     Left Ear: Hearing and external ear normal.     Nose: Nose normal. No congestion or rhinorrhea.     Mouth/Throat:     Lips: Pink. No lesions.     Mouth: Mucous membranes are moist.     Pharynx: Oropharynx is clear.  Eyes:     General: Lids are normal. Vision grossly intact. Gaze aligned appropriately. No allergic shiner or scleral icterus.       Right eye: No discharge.        Left eye: No discharge.      Extraocular Movements: Extraocular movements intact.     Conjunctiva/sclera: Conjunctivae normal.     Pupils: Pupils are equal, round, and reactive to light.  Neck:     Trachea: Trachea and phonation normal.  Pulmonary:     Effort: Pulmonary effort is normal.     Breath sounds: Normal breath sounds and air entry. No stridor or transmitted upper airway sounds. No wheezing.     Comments: Spoke full sentences without difficulty; no cough observed in exam room Abdominal:     General: Abdomen is flat.  Musculoskeletal:        General: Normal range of motion.     Right hand: Normal strength. Normal capillary refill.     Left hand: Normal strength. Normal capillary refill.     Cervical back: Normal range of motion and neck supple. No edema, erythema, signs of trauma, rigidity, torticollis or crepitus.  Lymphadenopathy:     Head:     Right side of head: No submandibular or preauricular adenopathy.     Left side of head: No submandibular or preauricular adenopathy.     Cervical:     Right cervical: No superficial cervical adenopathy.    Left cervical: No superficial cervical adenopathy.  Skin:    General: Skin is warm and dry.     Capillary Refill: Capillary refill takes less than 2 seconds.  Coloration: Skin is not ashen, cyanotic, jaundiced, mottled, pale or sallow.     Findings: No abrasion, abscess, acne, bruising, burn, ecchymosis, erythema, signs of injury, laceration, lesion, petechiae, rash or wound.     Nails: There is no clubbing.     Comments: Visually inspected face, neck and arms/hands  Neurological:     General: No focal deficit present.     Mental Status: He is alert and oriented to person, place, and time. Mental status is at baseline.     GCS: GCS eye subscore is 4. GCS verbal subscore is 5. GCS motor subscore is 6.     Cranial Nerves: Cranial nerves 2-12 are intact. No cranial nerve deficit, dysarthria or facial asymmetry.     Motor: Motor function is intact. No  weakness, tremor, atrophy, abnormal muscle tone or seizure activity.     Coordination: Coordination is intact. Coordination normal.     Gait: Gait is intact. Gait normal.     Comments: In/out of chair without difficulty; gait sure and steady in clinic; bilateral hand grasp equal 5/5  Psychiatric:        Attention and Perception: Attention and perception normal.        Mood and Affect: Mood and affect normal.        Speech: Speech normal.        Behavior: Behavior normal. Behavior is cooperative.        Thought Content: Thought content normal.        Cognition and Memory: Cognition and memory normal.        Judgment: Judgment normal.     Latest Reference Range & Units 05/30/21 09:00 11/03/21 10:50 06/13/22 11:43 08/24/22 13:00 11/27/22 11:35 12/05/22 11:30 05/31/23 11:32 09/27/23 00:00  Sodium 134 - 144 mmol/L 140 142  136    141  Potassium 3.5 - 5.2 mmol/L 4.1 4.0  3.8    4.1  Chloride 96 - 106 mmol/L 103 101  101    102  CO2 20 - 29 mmol/L  22        Glucose 70 - 99 mg/dL 93 80  161 (H)    096 (H)  BUN 8 - 27 mg/dL 15 14  12    19   Creatinine 0.76 - 1.27 mg/dL 0.45 (H) 4.09  8.11    1.25  Calcium 8.6 - 10.2 mg/dL 9.3 9.3  9.4    9.5  BUN/Creatinine Ratio 10 - 24  11 13  10    15   eGFR >59 mL/min/1.73 61 79  71    64  Phosphorus 2.8 - 4.1 mg/dL 2.7 (L)   2.6 (L)    2.8  Alkaline Phosphatase 44 - 121 IU/L 84   76    81  Albumin 3.9 - 4.9 g/dL 4.9 (H)   4.6    4.6  Albumin/Globulin Ratio 1.2 - 2.2  1.9   1.8      Uric Acid 3.8 - 8.4 mg/dL 6.9   6.0    7.2  AST 0 - 40 IU/L 21   21    22   ALT 0 - 44 IU/L 29   28    30   Total Protein 6.0 - 8.5 g/dL 7.5   7.2    7.5  Total Bilirubin 0.0 - 1.2 mg/dL 0.7   1.1    1.1  GGT 0 - 65 IU/L 25   18    23   Estimated CHD Risk 0.0 - 1.0 times avg. 1.0  0.7    0.9  LDH 121 - 224 IU/L 191   174    167  Total CHOL/HDL Ratio 0.0 - 5.0 ratio 4.9   3.8    4.4  Cholesterol, Total 100 - 199 mg/dL 706 (H)   237    628  HDL Cholesterol >39 mg/dL 43   36  (L)    40  Triglycerides 0 - 149 mg/dL 315 (H)   176 (H)    160  VLDL Cholesterol Cal 5 - 40 mg/dL 34   29    26  LDL Chol Calc (NIH) 0 - 99 mg/dL 737 (H)   72    106 (H)  Iron 38 - 169 ug/dL 81   95    269  Vitamin D, 25-Hydroxy 30.0 - 100.0 ng/mL    9.4 (L) 32.5     Globulin, Total 1.5 - 4.5 g/dL 2.6   2.6    2.9  WBC 3.4 - 10.8 x10E3/uL 6.4   7.0    6.6  RBC 4.14 - 5.80 x10E6/uL 5.74   5.45    5.87 (H)  Hemoglobin 13.0 - 17.7 g/dL 48.5   46.2    70.3  HCT 37.5 - 51.0 % 51.4 (H)   49.6    53.7 (H)  MCV 79 - 97 fL 90   91    92  MCH 26.6 - 33.0 pg 30.1   30.3    30.2  MCHC 31.5 - 35.7 g/dL 50.0   93.8    18.2  RDW 11.6 - 15.4 % 12.7   12.8    12.9  Platelets 150 - 450 x10E3/uL 195   184    208  Neutrophils Not Estab. % 56   60    54  Immature Granulocytes Not Estab. % 0   0    1  NEUT# 1.4 - 7.0 x10E3/uL 3.6   4.2    3.7  Lymphs Abs 0.7 - 3.1 x10E3/uL 2.3   2.1    2.3  Monocytes Absolute 0.1 - 0.9 x10E3/uL 0.4   0.6    0.4  Basophils Absolute 0.0 - 0.2 x10E3/uL 0.0   0.0    0.0  Immature Grans (Abs) 0.0 - 0.1 x10E3/uL 0.0   0.0    0.0  Lymphs Not Estab. % 35   30    36  Monocytes Not Estab. % 7   8    7   Basos Not Estab. % 1   1    1   Eos Not Estab. % 1   1    1   EOS (ABSOLUTE) 0.0 - 0.4 x10E3/uL 0.1   0.1    0.1  POC Glucose 70 - 99 mg/dl       993 !   Hemoglobin A1C 4.8 - 5.6 % 6.0 (H) 5.8 (H) 6.0 (H) 5.9 (H)  5.9 (H)  5.8 (H)  Est. average glucose Bld gHb Est-mCnc mg/dL        716  TSH 9.678 - 4.500 uIU/mL 1.050   0.827    0.850  Thyroxine (T4) 4.5 - 12.0 ug/dL 5.4   5.1    5.7  Free Thyroxine Index 1.2 - 4.9  1.4   1.4    1.7  T3 Uptake Ratio 24 - 39 % 26   28    29   Prostate Specific Ag, Serum 0.0 - 4.0 ng/mL 0.6   0.4    0.5  (H): Data is abnormally high (L): Data  is abnormally low !: Data is abnormal        Assessment & Plan:   A-prediabetes, hyperlipidemia  P-Your 3 month blood sugar (Hgba1c), LDL, spot glucose, red blood cells/hematocrit were elevated  Hgba1c  slghtly improved.  Cholesterol greatly improved from previous.  Your red blood cells have been elevated in the past also.   I recommend routine follow up with PCM. Avoiding dehydration.  Discussed increased risk for clots if dehydrated and elevated RBCs. Appt with [facility] dietitian which is free link to schedule [name] ([email or web address]).  Avoid dehydration drink water to keep urine pale yellow clear and voiding every 2-4 hours while awake.  I recommend 5-10lb weight loss and 150 minutes activity per week.  I recommend activity after eating meals/snacks.  Keep added sugars in diet daily less than 7 teaspoons/150 calories per day.  I recommend 30 grams fiber per day in diet.  Next labs recommended in 6 to 12 months.  6 months if weight gain/feeling bad and 12 months if weight loss and feeling well.  You met requirements for Be Well 2026 insurance discount [employer] will be sending out paperwork in February please complete and bring to clinic.  Your liver/kidney/thyroid function tests normal, prostate, iron, electrolytes were normal.  No infection noted on complete blood count.  Patient verbalized understanding information/instructions, agreed with plan of care and had no further questions at that time.  LFTs stable;  total and LDL cholesterol slightly worse and triglycerides/HDL improved.  Discussed activity/fiber in diet and taking medication every day.  Dispensed 90 tabs atorvastatin 40mg  po daily to patient from [facility] today.  Discussed notify clinic staff 1-2 weeks prior to running out for next fill.  Patient verbalized understanding information/instructions, agreed with plan of care and had no further questions at this time

## 2024-02-26 ENCOUNTER — Encounter: Payer: Self-pay | Admitting: Registered Nurse

## 2024-02-26 ENCOUNTER — Ambulatory Visit: Payer: Self-pay | Admitting: Registered Nurse

## 2024-02-26 VITALS — BP 134/85 | HR 65 | Temp 97.7°F

## 2024-02-26 DIAGNOSIS — M7652 Patellar tendinitis, left knee: Secondary | ICD-10-CM

## 2024-02-26 DIAGNOSIS — G8929 Other chronic pain: Secondary | ICD-10-CM

## 2024-02-26 DIAGNOSIS — E785 Hyperlipidemia, unspecified: Secondary | ICD-10-CM

## 2024-02-26 MED ORDER — ACETAMINOPHEN 325 MG PO TABS: 650.0000 mg | ORAL_TABLET | Freq: Four times a day (QID) | ORAL | Status: AC | PRN

## 2024-02-26 NOTE — Patient Instructions (Signed)
 Exercises for Chronic Knee Pain Chronic knee pain is pain that lasts longer than 3 months. For most people with chronic knee pain, exercise and weight loss is an important part of treatment. Your health care provider may want you to focus on: Making the muscles that support your knee stronger. This can take pressure off your knee and reduce pain. Preventing knee stiffness. How far you can move your knee, keeping it there or making it farther. Losing weight (if this applies) to take pressure off your knee, lower your risk for injury, and make it easier for you to exercise. Your provider will help you make an exercise program that fits your needs and physical abilities. Below are simple, low-impact exercises you can do at home. Ask your provider or physical therapist how often you should do your exercise program and how many times to repeat each exercise. General safety tips  Get your provider's approval before doing any exercises. Start slowly and stop any time you feel pain. Do not exercise if your knee pain is flaring up. Warm up first. Stretching a cold muscle can cause an injury. Do 5-10 minutes of easy movement or light stretching before beginning your exercises. Do 5-10 minutes of low-impact activity (like walking or cycling) before starting strengthening exercises. Contact your provider any time you have pain during or after exercising. Exercise can cause discomfort but should not be painful. It is normal to be a little stiff or sore after exercising. Stretching and range-of-motion exercises Front thigh stretch  Stand up straight and support your body by holding on to a chair or resting one hand on a wall. With your legs straight and close together, bend one knee to lift your heel up toward your butt. Using one hand for support, grab your ankle with your free hand. Pull your foot up closer toward your butt to feel the stretch in front of your thigh. Hold the stretch for 30  seconds. Repeat ___3_______ times. Complete this exercise __2________ times a day. Back thigh stretch  Sit on the floor with your back straight and your legs out straight in front of you. Place the palms of your hands on the floor and slide them toward your feet as you bend at the hip. Try to touch your nose to your knees and feel the stretch in the back of your thighs. Hold for 30 seconds. Repeat ______3____ times. Complete this exercise _____2_____ times a day. Calf stretch  Stand facing a wall. Place the palms of your hands flat against the wall, arms extended, and lean slightly against the wall. Get into a lunge position with one leg bent at the knee and the other leg stretched out straight behind you. Keep both feet facing the wall and increase the bend in your knee while keeping the heel of the other leg flat on the ground. You should feel the stretch in your calf. Hold for 30 seconds. Repeat _______3___ times. Complete this exercise _____2_____ times a day. Strengthening exercises Straight leg lift  Lie on your back with one knee bent and the other leg out straight. Slowly lift the straight leg without bending the knee. Lift until your foot is about 12 inches (30 cm) off the floor. Hold for 3-5 seconds and slowly lower your leg. Repeat ____3______ times. Complete this exercise ___2_______ times a day. Single leg dip  Stand between two chairs and put both hands on the backs of the chairs for support. Extend one leg out straight with your body  weight resting on the heel of the standing leg. Slowly bend your standing knee to dip your body to the level that is comfortable for you. Hold for 3-5 seconds. Repeat ______3____ times. Complete this exercise _____2_____ times a day. Hamstring curls  Stand straight, knees close together, facing the back of a chair. Hold on to the back of a chair with both hands. Keep one leg straight. Bend the other knee while bringing the heel up  toward the butt until the knee is bent at a 90-degree angle (right angle). Hold for 3-5 seconds. Repeat _____3_____ times. Complete this exercise ____2______ times a day. Wall squat  Stand straight with your back, hips, and head against a wall. Step forward one foot at a time with your back still against the wall. Your feet should be 2 feet (61 cm) from the wall at shoulder width. Keeping your back, hips, and head against the wall, slide down the wall to as close to a sitting position as you can get. Hold for 5-10 seconds, then slowly slide back up. Repeat ____3______ times. Complete this exercise ______2____ times a day. Step-ups  Stand in front of a sturdy platform or stool that is about 6 inches (15 cm) high. Slowly step up with your left / right foot, keeping your knee in line with your hip and foot. Do not let your knee bend so far that you cannot see your toes. Hold on to a chair for balance, but do not use it for support. Slowly unlock your knee and lower yourself to the starting position. Repeat ____3______ times. Complete this exercise _____2_____ times a day. Contact a health care provider if: Your exercises cause pain. Your pain is worse after you exercise. Your pain prevents you from doing your exercises. This information is not intended to replace advice given to you by your health care provider. Make sure you discuss any questions you have with your health care provider. Document Revised: 09/12/2022 Document Reviewed: 09/12/2022 Elsevier Patient Education  2024 [employer]Tendinitis  Tendinitis is inflammation of a tendon. A tendon is a strong cord of tissue that connects muscle to bone. Tendinitis can affect any tendon, but it most commonly affects the: Shoulder tendon (biceps tendon or rotator cuff). Ankle tendon (Achilles tendon). Elbow tendons. Tendons in the wrist. What are the causes? This condition may be caused by: Overusing a tendon or muscle. This is the  most common cause. Age-related wear and tear. Injury. Inflammatory conditions, such as arthritis. Certain medicines. What increases the risk? You are more likely to develop this condition if you do activities that involve the same movements over and over again (repetitive motions). What are the signs or symptoms? Symptoms of this condition may include: Pain. Tenderness. Mild swelling. Decreased range of motion. How is this diagnosed? This condition is diagnosed with a physical exam. You may also have tests, such as: Ultrasound. This uses sound waves to make an image of the inside of your body in the affected area. MRI. This uses magnetic fields and radio waves to make an image of the inside of your body in the affected area. How is this treated? This condition may be treated by resting, icing, applying pressure (compression), and raising (elevating) the affected area above the level of your heart. This is known as RICE therapy. Treatment may also include: Medicines to help reduce inflammation or to help reduce pain. Exercises or physical therapy to improve movement and strength of the tendon. A brace or splint. Injection of  corticosteroid medicine. This may be done in some cases. Surgery. This is rarely needed and only used if all other treatment has failed. Follow these instructions at home: If you have a removable splint or brace: Wear the splint or brace as told by your health care provider. Remove it only as told by your health care provider. Check the skin around the splint or brace every day. Tell your health care provider about any concerns. Loosen the splint or brace if your fingers or toes tingle, become numb, or turn cold and blue. Keep the splint or brace clean. If the splint or brace is not waterproof: Do not let it get wet. Cover it with a watertight covering when you take a bath or shower, or remove it as told by your health care provider. Managing pain, stiffness, and  swelling     If directed, put ice on the affected area. To do this: If you have a removable splint or brace, remove it as told by your health care provider. Put ice in a plastic bag. Place a towel between your skin and the bag. Leave the ice on for 20 minutes, 2-3 times a day. Remove the ice if your skin turns bright red. This is very important. If you cannot feel pain, heat, or cold, you have a greater risk of damage to the area. Move the fingers or toes of the affected limb often, if this applies. This can help to reduce stiffness and swelling. If directed, elevate the affected area above the level of your heart while you are sitting or lying down. If directed, apply heat to the affected area before you exercise. Use the heat source that your health care provider recommends, such as a moist heat pack or a heating pad. Place a towel between your skin and the heat source. Leave the heat on for 20-30 minutes. Remove the heat if your skin turns bright red. This is especially important if you are unable to feel pain, heat, or cold. You have a greater risk of getting burned. Activity Rest the affected area as told by your health care provider. Ask your health care provider when it is safe to drive if you have a splint or brace on any part of your arm or leg. Return to your normal activities as told by your health care provider. Ask your health care provider what activities are safe for you. Avoid using the affected area while you are experiencing symptoms of tendinitis. Do exercises as told by your health care provider. General instructions Wear an elastic bandage or compression wrap only as told by your health care provider. Take over-the-counter and prescription medicines only as told by your health care provider. Keep all follow-up visits. This is important. Contact a health care provider if: Your symptoms do not improve. You develop new, unexplained problems, such as numbness in your  hands or feet. Summary Tendinitis is inflammation of a tendon. You are more likely to develop this condition if you do activities that involve the same movements over and over again. This condition may be treated by resting, icing, applying pressure (compression), and elevating the area above the level of your heart. This is known as RICE therapy. Avoid using the affected area while you are experiencing symptoms of tendinitis. This information is not intended to replace advice given to you by your health care provider. Make sure you discuss any questions you have with your health care provider. Document Revised: 05/05/2021 Document Reviewed: 05/05/2021 Elsevier Patient Education  2025 [employer]Dizziness Dizziness is a common problem. It makes you feel unsteady or light-headed. You may feel like you're about to faint. Dizziness can lead to getting hurt if you stumble or fall. It's more common to feel dizzy if you're an older adult. Many things can cause you to feel dizzy. These include: Medicines. Dehydration. This is when there's not enough water in your body. Illness. Follow these instructions at home: Eating and drinking  Drink enough fluid to keep your pee (urine) pale yellow. This helps keep you from getting dehydrated. Try to drink more clear fluids, such as water. Do not drink alcohol. Try to limit how much caffeine you take in. Try to limit how much salt, also called sodium, you take in. Activity Try not to make quick movements. Stand up slowly from sitting in a chair. Steady yourself until you feel okay. In the morning, first sit up on the side of the bed. When you feel okay, hold onto something and slowly stand up. Do this until you know that your balance is okay. If you need to stand in one place for a long time, move your legs often. Tighten and relax the muscles in your legs while you're standing. Do not drive or use machines if you feel dizzy. Avoid bending down if you  feel dizzy. Place items in your home so you can reach them without leaning over. Lifestyle Do not smoke, vape, or use products with nicotine or tobacco in them. If you need help quitting, talk with your health care provider. Try to lower your stress level. You can do this by using methods like yoga or meditation. Talk with your provider if you need help. General instructions Watch your dizziness for any changes. Take your medicines only as told by your provider. Talk with your provider if you think you're dizzy because of a medicine you're taking. Tell a friend or a family member that you're feeling dizzy. If they spot any changes in your behavior, have them call your provider. Contact a health care provider if: Your dizziness doesn't go away, or you have new symptoms. Your dizziness gets worse. You feel like you may vomit. You have trouble hearing. You have a fever. You have neck pain or a stiff neck. You fall or get hurt. Get help right away if: You vomit each time you eat or drink. You have watery poop and can't eat or drink. You have trouble talking, walking, swallowing, or using your arms, hands, or legs. You feel very weak. You're bleeding. You're not thinking clearly, or you have trouble forming sentences. A friend or family member may spot this. Your vision changes, or you get a very bad headache. These symptoms may be an emergency. Call 911 right away. Do not wait to see if the symptoms will go away. Do not drive yourself to the hospital. This information is not intended to replace advice given to you by your health care provider. Make sure you discuss any questions you have with your health care provider. Document Revised: 05/31/2023 Document Reviewed: 10/12/2022 Elsevier Patient Education  2024 [employer]Arthritis Arthritis is a term that is commonly used to refer to joint pain or joint disease. There are more than 100 types of arthritis. What are the causes? The most  common cause of this condition is wear and tear of a joint. Other causes include: Gout. Inflammation of a joint. An infection of a joint. Sprains and other injuries near the joint. A reaction to medicines or  drugs, or an allergic reaction. In some cases, the cause may not be known. What are the signs or symptoms? The main symptom of this condition is pain in the joint during movement. Other symptoms include: Redness, swelling, or stiffness at a joint. Warmth coming from the joint. Fever. Overall feeling of illness. How is this diagnosed? This condition may be diagnosed with a physical exam and tests, including: Blood tests. Urine tests. Imaging tests, such as X-rays, an MRI, or a CT scan. Sometimes, fluid is removed from a joint for testing. How is this treated? This condition may be treated with: Treatment of the cause, if it is known. Rest. Raising (elevating) the joint. Applying cold or hot packs to the joint. Medicines to improve symptoms and reduce inflammation. Injections of a steroid, such as cortisone, into the joint to help reduce pain and inflammation. Depending on the cause of your arthritis, you may need to make lifestyle changes to reduce stress on your joint. Changes may include: Exercising more. Losing weight. Follow these instructions at home: Medicines Take over-the-counter and prescription medicines only as told by your health care provider. Do not take aspirin to relieve pain if your health care provider thinks that gout may be causing your pain. Activity Rest your joint if told by your health care provider. Rest is important when your disease is active and your joint feels painful, swollen, or stiff. Avoid activities that make the pain worse. Balance activity with rest. Exercise your joint regularly with range-of-motion exercises as told by your health care provider. Try doing low-impact exercise, such as: Swimming. Water  aerobics. Biking. Walking. Managing pain, stiffness, and swelling     If directed, put ice on the affected joint. To do this: Put ice in a plastic bag. Place a towel between your skin and the bag. Leave the ice on for 20 minutes, 2-3 times a day. Remove the ice if your skin turns bright red. This is very important. If you cannot feel pain, heat, or cold, you have a greater risk of damage to the area. If your joint is swollen, raise (elevate) it above the level of your heart if directed by your health care provider. If your joint feels stiff in the morning, try taking a warm shower. If directed, apply heat to the affected area as often as told by your health care provider. Use the heat source that your health care provider recommends, such as a moist heat pack or a heating pad. To apply heat: Place a towel between your skin and the heat source. Leave the heat on for 20-30 minutes. Remove the heat if your skin turns bright red. This is especially important if you are unable to feel pain, heat, or cold. You have a greater risk of getting burned. General instructions Maintain a healthy weight. Follow instructions from your health care provider for weight control. Do not use any products that contain nicotine or tobacco. These products include cigarettes, chewing tobacco, and vaping devices, such as e-cigarettes. If you need help quitting, ask your health care provider. Keep all follow-up visits. This is important. Where to find more information [employer]: www.niams.http://www.myers.net/ Contact a health care provider if: The pain gets worse. You have a fever. Get help right away if: You develop severe joint pain, swelling, or redness. Many joints become painful and swollen. You develop severe back pain. You develop severe weakness in your leg. Summary Arthritis is a term that is commonly used to refer to joint pain  or joint disease. There are more than 100 types of arthritis. The  most common cause of this condition is wear and tear of a joint. Other causes include gout, inflammation or infection of the joint, sprains, or allergies. Symptoms of this condition include redness, swelling, or stiffness of the joint. Other symptoms include warmth, fever, or feeling ill. This condition is treated with rest, elevation, medicines, and applying cold or hot packs. Follow your health care provider's instructions about medicines, activity, exercises, and other home care treatments. This information is not intended to replace advice given to you by your health care provider. Make sure you discuss any questions you have with your health care provider. Document Revised: 06/07/2021 Document Reviewed: 06/07/2021 Elsevier Patient Education  2024 [employer].

## 2024-02-26 NOTE — Progress Notes (Signed)
Pt c/o bilateral knee pain.

## 2024-02-26 NOTE — Progress Notes (Signed)
 Established Patient Office Visit  Subjective   Patient ID: Jason Swanson, male    DOB: January 03, 1956  Age: 68 y.o. MRN: [id number]  Chief Complaint  Patient presents with   Pain    Knee for months not improving has used biofreeze and ice/stretches with intermittent relief but never long term; knee sleeves from yesterday too stiff hard to walk with them on   Dizziness    Intermittent with picking up boxes at work from the floor    68y/o african [place] male established reported bilateral knee pain worsening over the past 5-6 months now having pain with walking and stairs.  Denied injury/rash/swelling/bruising.  Has tried ice and biofreeze helped a little.  Usually front of knee and sometimes side and going down side of calf bilatreally but radiation mostly left.  Tries stretching when that happens.  Not taking any oral medication.  Ran out of atorvastatin  last filled 10/04/23 [facility] 40mg  daily needs refill.  PCM [facility] no longer on omeprazole   [facility] labs done 09/27/23  Patient also reported having dizziness after picking up boxes off floor at work.      Review of Systems  Constitutional:  Negative for chills, diaphoresis, fever and weight loss.  HENT:  Negative for congestion, ear discharge, ear pain, sinus pain and sore throat.   Eyes:  Negative for blurred vision, double vision, photophobia, pain, discharge and redness.  Respiratory:  Negative for cough, hemoptysis, shortness of breath, wheezing and stridor.   Cardiovascular:  Negative for chest pain, palpitations, orthopnea, claudication and leg swelling.  Gastrointestinal:  Positive for abdominal pain and heartburn.  Genitourinary:  Negative for dysuria.  Musculoskeletal:  Positive for joint pain and myalgias. Negative for back pain, falls and neck pain.  Skin:  Negative for itching and rash.  Neurological:  Positive for dizziness and headaches. Negative for tingling, tremors, sensory change, speech change, focal weakness,  seizures and weakness.  Psychiatric/Behavioral:  The patient does not have insomnia.       Objective:     BP 134/85 (BP Location: Left Arm, Patient Position: Sitting)   Pulse 65   Temp 97.7 F (36.5 C) (Temporal)   SpO2 97%    Physical Exam Vitals and nursing note reviewed.  Constitutional:      General: He is awake. He is not in acute distress.    Appearance: Normal appearance. He is well-developed, well-groomed and overweight. He is not ill-appearing, toxic-appearing or diaphoretic.  HENT:     Head: Normocephalic and atraumatic.     Jaw: There is normal jaw occlusion.     Salivary Glands: Right salivary gland is not diffusely enlarged. Left salivary gland is not diffusely enlarged.     Right Ear: Hearing and external ear normal. No decreased hearing noted. No laceration, drainage, swelling or tenderness. A middle ear effusion is present. There is no impacted cerumen. No foreign body. No mastoid tenderness. No PE tube. No hemotympanum. Tympanic membrane is not injected, scarred, perforated, erythematous, retracted or bulging.     Left Ear: Hearing and external ear normal. No decreased hearing noted. No laceration, drainage, swelling or tenderness. A middle ear effusion is present. There is no impacted cerumen. No foreign body. No mastoid tenderness. No PE tube. No hemotympanum. Tympanic membrane is not injected, scarred, perforated, erythematous, retracted or bulging.     Nose: Nose normal. No congestion or rhinorrhea.     Right Nostril: No epistaxis.     Left Nostril: No epistaxis.  Right Turbinates: Not enlarged, swollen or pale.     Left Turbinates: Not enlarged, swollen or pale.     Right Sinus: No maxillary sinus tenderness or frontal sinus tenderness.     Left Sinus: No maxillary sinus tenderness or frontal sinus tenderness.     Comments: Bilateral allergic shiners; bilateral TMs intact air fluid level clear; no debris noted in bilateral auditory canals; cobblestoning  posterior pharynx;    Mouth/Throat:     Lips: Pink. No lesions.     Mouth: Mucous membranes are moist. No oral lesions or angioedema.     Dentition: No gum lesions.     Tongue: No lesions. Tongue does not deviate from midline.     Palate: No mass and lesions.     Pharynx: Uvula midline. Pharyngeal swelling and postnasal drip present. No oropharyngeal exudate, posterior oropharyngeal erythema or uvula swelling.     Tonsils: No tonsillar exudate.   Eyes:     General: Lids are normal. Vision grossly intact. Gaze aligned appropriately. Allergic shiner present. No scleral icterus.       Right eye: No discharge.        Left eye: No discharge.     Extraocular Movements: Extraocular movements intact.     Right eye: Normal extraocular motion and no nystagmus.     Left eye: Normal extraocular motion and no nystagmus.     Conjunctiva/sclera: Conjunctivae normal.     Pupils: Pupils are equal, round, and reactive to light.   Neck:     Trachea: Trachea and phonation normal.   Cardiovascular:     Rate and Rhythm: Normal rate and regular rhythm.     Pulses: Normal pulses.          Radial pulses are 2+ on the right side and 2+ on the left side.     Heart sounds: Normal heart sounds, S1 normal and S2 normal. Heart sounds not distant. No murmur heard.    No systolic murmur is present.     No diastolic murmur is present.     Comments: RRR Pulmonary:     Effort: Pulmonary effort is normal.     Breath sounds: Normal breath sounds and air entry. No stridor or transmitted upper airway sounds. No decreased breath sounds, wheezing or rhonchi.     Comments: Spoke full sentences without difficulty; no cough observed in exam room Abdominal:     General: Abdomen is flat.   Musculoskeletal:        General: Normal range of motion.     Right hand: Normal strength. Normal capillary refill.     Left hand: Normal strength. Normal capillary refill.     Cervical back: Normal range of motion and neck supple. No  swelling, edema, deformity, erythema, signs of trauma, lacerations, rigidity, torticollis, tenderness or crepitus. No pain with movement or muscular tenderness. Normal range of motion.     Thoracic back: No swelling, edema, deformity, signs of trauma, lacerations, spasms or tenderness.     Right knee: No swelling, deformity, effusion, erythema, ecchymosis, lacerations, bony tenderness or crepitus. Normal range of motion. Tenderness present. No medial joint line, lateral joint line, MCL, LCL, ACL, PCL or patellar tendon tenderness. No LCL laxity, MCL laxity, ACL laxity or PCL laxity. Abnormal patellar mobility. Normal alignment and normal meniscus.     Left knee: Bony tenderness and crepitus present. Tenderness present over the lateral joint line and patellar tendon. No medial joint line, MCL, LCL, ACL or PCL tenderness. No LCL laxity,  MCL laxity, ACL laxity or PCL laxity.Normal alignment, normal meniscus and normal patellar mobility.     Right lower leg: No swelling, deformity, lacerations, tenderness or bony tenderness. No edema.     Left lower leg: No deformity, lacerations, tenderness or bony tenderness. No edema.     Right ankle: No swelling, deformity, ecchymosis or lacerations. No tenderness. Normal range of motion.     Left ankle: No swelling, deformity, ecchymosis or lacerations. No tenderness. Normal range of motion.     Comments: Crepitus left only intermittent; McMurray and Lachman/valgus/varus/anterior and posterior drawer signs negative bilaterally; mild pain with patellar palpation right and lateral joint line palpation left; no pain posterior fossa palpation  Lymphadenopathy:     Head:     Right side of head: No submandibular or preauricular adenopathy.     Left side of head: No submandibular or preauricular adenopathy.     Cervical:     Right cervical: No superficial, deep or posterior cervical adenopathy.    Left cervical: No superficial, deep or posterior cervical adenopathy.    Skin:    General: Skin is warm and dry.     Capillary Refill: Capillary refill takes less than 2 seconds.     Coloration: Skin is not ashen, cyanotic, jaundiced, mottled, pale or sallow.     Findings: No abrasion, abscess, acne, bruising, burn, ecchymosis, erythema, signs of injury, laceration, lesion, petechiae, rash or wound.     Nails: There is no clubbing.     Comments: Anterior neck/face/hands and knees visually inspected; calf high compression socks on not removed nor were shoes; treads worn midfoot and heel sneakers   Neurological:     General: No focal deficit present.     Mental Status: He is alert and oriented to person, place, and time. Mental status is at baseline.     GCS: GCS eye subscore is 4. GCS verbal subscore is 5. GCS motor subscore is 6.     Cranial Nerves: No cranial nerve deficit, dysarthria or facial asymmetry.     Sensory: Sensation is intact.     Motor: Motor function is intact. No weakness, tremor, atrophy, abnormal muscle tone or seizure activity.     Coordination: Coordination is intact. Coordination normal.     Gait: Gait is intact. Gait normal.     Comments: In/out of chair and on/off exam table without difficulty; gait sure and steady in clinic; bilateral hand grasp equal 5/5  Psychiatric:        Attention and Perception: Attention and perception normal.        Mood and Affect: Mood and affect normal.        Speech: Speech normal.        Behavior: Behavior normal. Behavior is cooperative.        Thought Content: Thought content normal.        Cognition and Memory: Cognition and memory normal.        Judgment: Judgment normal.      No results found for any visits on 02/26/24.    The 10-year ASCVD risk score (Arnett DK, et al., 2019) is: 11.8%  Latest Reference Range & Units 09/27/23 00:00  Sodium 134 - 144 mmol/L 141  Potassium 3.5 - 5.2 mmol/L 4.1  Chloride 96 - 106 mmol/L 102  Glucose 70 - 99 mg/dL 161 (H)  BUN 8 - 27 mg/dL 19  Creatinine  0.96 - 1.27 mg/dL 0.45  Calcium  8.6 - 10.2 mg/dL 9.5  BUN/Creatinine Ratio 10 -  24  15  eGFR >59 mL/min/1.73 64  Phosphorus 2.8 - 4.1 mg/dL 2.8  Alkaline Phosphatase 44 - 121 IU/L 81  Albumin 3.9 - 4.9 g/dL 4.6  Uric Acid 3.8 - 8.4 mg/dL 7.2  AST 0 - 40 IU/L 22  ALT 0 - 44 IU/L 30  Total Protein 6.0 - 8.5 g/dL 7.5  Total Bilirubin 0.0 - 1.2 mg/dL 1.1  GGT 0 - 65 IU/L 23  Estimated CHD Risk 0.0 - 1.0 times avg. 0.9  LDH 121 - 224 IU/L 167  Total CHOL/HDL Ratio 0.0 - 5.0 ratio 4.4  Cholesterol, Total 100 - 199 mg/dL 161  HDL Cholesterol >09 mg/dL 40  Triglycerides 0 - 604 mg/dL 540  VLDL Cholesterol Cal 5 - 40 mg/dL 26  LDL Chol Calc (NIH) 0 - 99 mg/dL 981 (H)  Iron 38 - 191 ug/dL 478  Globulin, Total 1.5 - 4.5 g/dL 2.9  WBC 3.4 - 29.5 A21H0/QM 6.6  RBC 4.14 - 5.80 x10E6/uL 5.87 (H)  Hemoglobin 13.0 - 17.7 g/dL 57.8  HCT 46.9 - 62.9 % 53.7 (H)  MCV 79 - 97 fL 92  MCH 26.6 - 33.0 pg 30.2  MCHC 31.5 - 35.7 g/dL 52.8  RDW 41.3 - 24.4 % 12.9  Platelets 150 - 450 x10E3/uL 208  Neutrophils Not Estab. % 54  Immature Granulocytes Not Estab. % 1  NEUT# 1.4 - 7.0 x10E3/uL 3.7  Lymphs Abs 0.7 - 3.1 x10E3/uL 2.3  Monocytes Absolute 0.1 - 0.9 x10E3/uL 0.4  Basophils Absolute 0.0 - 0.2 x10E3/uL 0.0  Immature Grans (Abs) 0.0 - 0.1 x10E3/uL 0.0  Lymphs Not Estab. % 36  Monocytes Not Estab. % 7  Basos Not Estab. % 1  Eos Not Estab. % 1  EOS (ABSOLUTE) 0.0 - 0.4 x10E3/uL 0.1  Hemoglobin A1C 4.8 - 5.6 % 5.8 (H)  Est. average glucose Bld gHb Est-mCnc mg/dL 010  TSH 2.725 - 3.664 uIU/mL 0.850  Thyroxine (T4) 4.5 - 12.0 ug/dL 5.7  Free Thyroxine Index 1.2 - 4.9  1.7  T3 Uptake Ratio 24 - 39 % 29  Prostate Specific Ag, Serum 0.0 - 4.0 ng/mL 0.5  (H): Data is abnormally high   Assessment & Plan:   Problem List Items Addressed This Visit       Other   Hyperlipidemia   Other Visit Diagnoses       Patellar tendonitis of left knee    -  Primary   Relevant Medications    acetaminophen  (TYLENOL ) 325 MG tablet     Chronic pain of right knee         Dispensed 9i0 tabs atorvastatin  40mg  to patient from [facility] today  next labs due Jan 2026.  Labs stable has had follow up appt with PCM this year  Didn't tolerate neoprene knee sleeves bilaterally.  Diameter bilateral central patella 17 stated knee sleeves too restrictive with his walking.  Trial 4 ace bandage left today and may return to obtain another ace bandage with RN Thersia Flax later today for right if left working well.  Discussed heat/ice/exercises.  Tylenol  650mg  po q6h prn pain OTC given 4 UD from clinic stock and follow up in 3 weeks for re-evaluation as NP on vacation until 18 Mar 2024  Exitcare handout on knee pain and knee exercises.  Discussed patellar tendonitis left e.g. the popping (crepitus); normal mcmurray and possible osteoarthritis.  Typically heat friend to arthritis along with tylenol  daily discussed buying at [employer] his cheapest option.  Consider imaging  if worsening joint line pain/orthopedics referral.  Consider weight loss.  Replace shoes that are over a year old and rotate 2 pairs every other day to allow midsole foam to reexpand and shoes to dry out thoroughly prevent fungal infections foot.   discussed he works on [employer] daily needs the cushion  Discussed hydrate with water to keep urine pale yellow clear and voiding every 2-4 hours while awake.  Avoid holding breath when picking up boxes.  Normal ear exam no nystagmus today.  BP stable HR sitting to standing decreases mid 50s then back to baseline 60s approximately 15 seconds after standing denied palpitations.  Same with bending over to simulate pickup up box off floor in exam room.  Discussed schedule follow up with [facility] for EKG and possible cardiology referral.  Discussed vagal response.  He also feels dizziness if having heartburn  exitcare handout on dizzyness printed and given to patient.  Patient agreed with plan of care and had no further  questions at this time.  No follow-ups on file.    Richardine Chancy, NP

## 2024-05-01 ENCOUNTER — Encounter: Payer: Self-pay | Admitting: Registered Nurse

## 2024-05-01 ENCOUNTER — Ambulatory Visit: Payer: Self-pay | Admitting: Registered Nurse

## 2024-05-01 VITALS — Resp 16

## 2024-05-01 DIAGNOSIS — G8929 Other chronic pain: Secondary | ICD-10-CM

## 2024-05-01 NOTE — Patient Instructions (Signed)
 Osteoarthritis  Osteoarthritis is a type of arthritis. It refers to joint pain or joint disease. Osteoarthritis affects tissue that covers the ends of bones in joints (cartilage). Cartilage acts as a cushion between the bones and helps them move smoothly. Osteoarthritis occurs when cartilage in the joints gets worn down. Osteoarthritis is sometimes called wear and tear arthritis. Osteoarthritis is the most common form of arthritis. It often occurs in older people. It is a condition that gets worse over time. The joints most often affected by this condition are in the fingers, toes, hips, knees, and spine, including the neck and lower back. What are the causes? This condition is caused by the wearing down of cartilage that covers the ends of bones. What increases the risk? The following factors may make you more likely to develop this condition: Being age 42 or older. Obesity. Overuse of joints. Past injury of a joint. Past surgery on a joint. Family history of osteoarthritis. What are the signs or symptoms? The main symptoms of this condition are pain, swelling, and stiffness in the joint. Other symptoms may include: An enlarged joint. More pain and further damage caused by small pieces of bone or cartilage that break off and float inside of the joint. Small deposits of bone (osteophytes) that grow on the edges of the joint. A grating or scraping feeling inside the joint when you move it. Popping or creaking sounds when you move. Difficulty walking or exercising. An inability to grip items, twist your hand, or control the movements of your hands and fingers. How is this diagnosed? This condition may be diagnosed based on: Your medical history. A physical exam. Your symptoms. X-rays of the affected joints. Blood tests to rule out other types of arthritis. How is this treated? There is no cure for this condition, but treatment can help control pain and improve joint function. Treatment  may include a combination of therapies, such as: Pain relief techniques, such as: Applying heat and cold to the joint. Massage. A form of talk therapy called cognitive behavioral therapy (CBT). This therapy helps you set goals and follow up on the changes that you make. Medicines for pain and inflammation. The medicines can be taken by mouth or applied to the skin. They include: NSAIDs, such as ibuprofen. Prescription medicines. Strong anti-inflammatory medicines (corticosteroids). Certain nutritional supplements. A prescribed exercise program. You may work with a physical therapist. Assistive devices, such as a brace, wrap, splint, specialized glove, or cane. A weight control plan. Surgery, such as: An osteotomy. This is done to reposition the bones and relieve pain or to remove loose pieces of bone and cartilage. Joint replacement surgery. You may need this surgery if you have advanced osteoarthritis. Follow these instructions at home: Activity Rest your affected joints as told by your health care provider. Exercise as told by your provider. The provider may recommend specific types of exercise, such as: Strengthening exercises. These are done to strengthen the muscles that support joints affected by arthritis. Aerobic activities. These are exercises, such as brisk walking or water aerobics, that increase your heart rate. Range-of-motion activities. These help your joints move more easily. Balance and agility exercises. Managing pain, stiffness, and swelling     If told, apply heat to the affected area as often as told by your provider. Use the heat source that your provider recommends, such as a moist heat pack or a heating pad. If you have a removable assistive device, remove it as told by your provider. Place a  towel between your skin and the heat source. If your provider tells you to keep the assistive device on while you apply heat, place a towel between the assistive device and  the heat source. Leave the heat on for 20-30 minutes. If told, put ice on the affected area. If you have a removable assistive device, remove it as told by your provider. Put ice in a plastic bag. Place a towel between your skin and the bag. If your provider tells you to keep the assistive device on during icing, place a towel between the assistive device and the bag. Leave the ice on for 20 minutes, 2-3 times a day. If your skin turns bright red, remove the ice or heat right away to prevent skin damage. The risk of damage is higher if you cannot feel pain, heat, or cold. Move your fingers or toes often to reduce stiffness and swelling. Raise (elevate) the affected area above the level of your heart while you are sitting or lying down. General instructions Take over-the-counter and prescription medicines only as told by your provider. Maintain a healthy weight. Follow instructions from your provider for weight control. Do not use any products that contain nicotine or tobacco. These products include cigarettes, chewing tobacco, and vaping devices, such as e-cigarettes. If you need help quitting, ask your provider. Use assistive devices as told by your provider. Where to find more information [employer] of Arthritis and Musculoskeletal and Skin Diseases: niams.http://www.myers.net/ [employer] on Aging: BaseRingTones.pl American College of Rheumatology: rheumatology.org Contact a health care provider if: You have redness, swelling, or a feeling of warmth in a joint that gets worse. You have a fever along with joint or muscle aches. You develop a rash. You have trouble doing your normal activities. You have pain that gets worse and is not relieved by pain medicine. This information is not intended to replace advice given to you by your health care provider. Make sure you discuss any questions you have with your health care provider. Document Revised: 04/27/2022 Document Reviewed:  04/27/2022 Elsevier Patient Education  2024 [employer]  Exercises for Core Strength Ask your health care provider which exercises are safe for you. Do exercises exactly as told by your provider and adjust them as told. It is normal to feel mild stretching, pulling, tightness, or discomfort as you do these exercises. Stop right away if you feel sudden pain or your pain gets worse. Do not start these exercises until told by your provider. Benefits of core strength exercises Core exercises make the muscles in your belly strong. These muscles help support your body and keep your back safe. It's important to keep your core strong to avoid getting hurt. Some activities, such as yoga and Pilates, can help to strengthen your core. You can also strengthen core muscles with home exercises. Always talk to your provider before you start new exercises. Core strength exercises can: Help reduce back pain. Help you get stronger after a back or spine injury. Prevent injury during physical activity, especially injuries to the back, hips, and knees. How to do core strength exercises Do each exercise 10-15 times, or until you feel tired. Stop if you feel any pain. If your pain continues or gets worse, call your provider. For exercises on the floor, use a soft exercise or yoga mat. Bridging  Lie on your back with your knees bent and feet flat on the floor. Raise your hips so that your knees, hips, and shoulders make a straight line.  Do not arch your back too much. Keep your belly muscles tight. Hold this position for 3-5 seconds. Slowly lower your hips back down. Relax your muscles between each time you do this. Single-leg bridge  Lie on your back with your knees bent and feet flat on the floor. Raise your hips so that your knees, hips, and shoulders make a straight line. Do not arch your back too much. Keep your belly muscles tight. Lift one foot off the floor and straighten that leg. Hold this position  for 3-5 seconds. Put your foot back down and lower your hips. Repeat with the other leg. Side bridge  Lie on your side with your knees bent. Prop yourself up on the elbow that is near the floor. Lift your hip and body off the floor so your shoulder, hip, and foot make a straight line. Hold this position for 10 seconds. Keep your head and neck in a normal position. Keep your belly muscles tight. Lower your hip back down. Repeat and try to hold this position longer, working your way up to 30 seconds. Abdominal crunch  Lie on your back with your knees bent and feet flat on the floor. Cross your arms over your chest. Without bending your neck, tip your chin slightly toward your chest. Tighten your belly muscles as you lift your chest just high enough to lift your shoulder blades off the floor. Do not hold your breath. Do not bend your neck. You can do this with short lifts or long lifts. Slowly go back to the starting position. Bird dog  Get on your hands and knees, with your legs shoulder-width apart and your arms under your shoulders. Keep your back straight. Tighten your belly muscles. Raise one leg and keep it straight, parallel to the floor. Lower your leg back down. Raise one arm and keep it straight, parallel to the floor. Slowly lower your arm back down. Repeat with the other arm and leg. If you can, try lifting one arm and the opposite leg at the same time. [place]ene on your belly. Prop up your body onto your forearms and your feet, keeping your legs straight. Your body should make a straight line. Hold this position for 10 seconds while keeping your belly muscles tight. Lower your body back down. Repeat and try to hold this position longer, working your way up to 30 seconds. Cross-core strengthening  Stand with your feet shoulder-width apart. Hold a ball out in front of you with straight arms. Tighten your belly muscles and slowly twist your waist from side to side. Keep  your feet flat. When you feel comfortable, try using a heavier ball. Top core strengthening  Stand about 18 inches (46 cm) out from a wall, with your back to it. Keep your feet flat and shoulder-width apart. Tighten your belly muscles. Bend your hips and knees. Slowly reach between your legs to touch the wall behind you. Slowly stand back up. Raise your arms over your head and reach behind you. Return to the starting position. This information is not intended to replace advice given to you by your health care provider. Make sure you discuss any questions you have with your health care provider. Document Revised: 08/28/2023 Document Reviewed: 08/28/2023 Elsevier Patient Education  2025 [employer]Thoracic Strain Rehab Ask your health care provider which exercises are safe for you. Do exercises exactly as told by your provider and adjust them as directed. It is normal to feel mild stretching, pulling, tightness, or  discomfort as you do these exercises. Stop right away if you feel sudden pain or your pain gets worse. Do not begin these exercises until told by your provider. Stretching and range-of-motion exercise This exercise warms up your muscles and joints and improves the movement and flexibility of your back and shoulders. This exercise also helps to relieve pain. Chest and spine stretch  Lie down on your back on a firm surface. Roll a towel or a small blanket so it is about 4 inches (10 cm) in diameter. Put the towel under the middle of your back so it is under your spine, but not under your shoulder blades. Put your hands behind your head and let your elbows fall to your sides. This will increase your stretch. Take a deep breath (inhale). Hold for _____30_____ seconds. Relax after you breathe out (exhale). Repeat _____3_____ times. Complete this exercise _____2_____ times a day. Strengthening exercises These exercises build strength and endurance in your back and your shoulder  blade muscles. Endurance is the ability to use your muscles for a long time, even after they get tired. Alternating arm and leg raises  Get on your hands and knees on a firm surface. If you are on a hard floor, you may want to use padding, such as an exercise mat, to cushion your knees. Line up your arms and legs. Your hands should be directly below your shoulders, and your knees should be directly below your hips. Lift your left leg behind you. At the same time, raise your right arm and straighten it in front of you. Do not lift your leg higher than your hip. Do not lift your arm higher than your shoulder. Keep your abdominal and back muscles tight. Keep your hips facing the ground. Do not arch your back. Carefully stay balanced. Do not hold your breath. Hold for _____30_____ seconds. Slowly return to the starting position and repeat with your right leg and your left arm. Repeat ________3__ times. Complete this exercise ______2____ times a day. Straight arm rows This exercise is also called the shoulder extension exercise. Stand with your feet shoulder width apart. Secure an exercise band to a stable object in front of you so the band is at or above shoulder height. Hold one end of the exercise band in each hand. Straighten your elbows and lift your hands up to shoulder height. Step back, away from the secured end of the exercise band, until the band stretches. Squeeze your shoulder blades together and pull your hands down to the sides of your thighs. Stop when your hands are straight down by your sides. This is shoulder extension. Do not let your hands go behind your body. Hold for ___30_______ seconds. Slowly return to the starting position. Repeat ____3______ times. Complete this exercise _____2_____ times a day. Rowing scapular retraction This is an exercise in which the shoulder blades (scapulae) are pulled toward each other (retraction). Sit in a stable chair without armrests, or  stand up. Secure an exercise band to a stable object in front of you so the band is at shoulder height. Hold one end of the exercise band in each hand. Your palms should face toward each other. Bring your arms out straight in front of you. Step back, away from the secured end of the exercise band, until the band stretches. Pull the band backward. As you do this, bend your elbows and squeeze your shoulder blades together, but avoid letting the rest of your body move. Do not shrug your  shoulders upward while you do this. Stop when your elbows are at your sides or slightly behind your body. Hold for ____30______ seconds. Slowly straighten your arms to return to the starting position. Repeat ____3______ times. Complete this exercise ____2______ times a day. Posture and body mechanics Good posture and healthy body mechanics can help to relieve stress in your body's tissues and joints. Body mechanics refers to the movements and positions of your body while you do your daily activities. Posture is part of body mechanics. Good posture means: Your spine is in its natural S-curve position (neutral). Your shoulders are pulled back slightly. Your head is not tipped forward. Follow these guidelines to improve your posture and body mechanics in your everyday activities. Standing  When standing, keep your spine neutral and your feet about hip width apart. Keep a slight bend in your knees. Your ears, shoulders, and hips should line up with each other. When you do a task in which you lean forward while standing in one place for a long time, place one foot up on a stable object that is 2-4 inches (5-10 cm) high, such as a footstool. This helps keep your spine neutral. Sitting  When sitting, keep your spine neutral and keep your feet flat on the floor. Use a footrest if needed. Keep your thighs parallel to the floor. Avoid rounding your shoulders, and avoid tilting your head forward. When working at a desk or a  computer, keep your desk at a height where your hands are slightly lower than your elbows. Slide your chair under your desk so you are close enough to maintain good posture. When working at a computer, place your monitor at a height where you are looking straight ahead and you do not have to tilt your head forward or downward to look at the screen. Resting When lying down and resting, avoid positions that are most painful for you. If you have pain with activities such as sitting, bending, stooping, or squatting (flexion-basedactivities), lie in a position in which your body does not bend very much. For example, avoid curling up on your side with your arms and knees near your chest (fetal position). If you have pain with activities such as standing for a long time or reaching with your arms (extension-basedactivities), lie with your spine in a neutral position and bend your knees slightly. Try the following positions: Lie on your side with a pillow between your knees. Lie on your back with a pillow under your knees.  Lifting  When lifting objects, keep your feet at least shoulder width apart and tighten your abdominal muscles. Bend your knees and hips and keep your spine neutral. It is important to lift using the strength of your legs, not your back. Do not lock your knees straight out. Always ask for help to lift heavy or awkward objects. This information is not intended to replace advice given to you by your health care provider. Make sure you discuss any questions you have with your health care provider. Document Revised: 02/09/2023 Document Reviewed: 04/17/2022 Elsevier Patient Education  2024 [employer].

## 2024-05-01 NOTE — Progress Notes (Signed)
 Established Patient Office Visit  Subjective   Patient ID: Jason Swanson, male    DOB: [birth year]  Age: 68 y.o. MRN: [id number]  Chief Complaint  Patient presents with   Pain    Right side/back    68y/o [place] male established patient here for evaluation of back pain.  Ongoing chronically but intermittent.  Typically doesn't take any pain medication due to stomach problems.  Right flank intermittent sharp pain at times and other times steady and will radiate down right leg.  This week has been more intermittent sharp flank wrapping around to abdomen.  Patient reported he has had imaging with PCM x 4 in the past.  Denied loss of bowel/bladder control, saddle paresthesias or arm/leg weakness, falls.  Denied trauma/rashes/bruising/swelling/dysuria/n/v/d.  Never took tylenol  for knee pain.  Knee doing a little better.  Trouble sleeping at night due to back pain tossing and turning at times because cannot get comfortable.      Review of Systems  Constitutional:  Negative for chills and fever.  Cardiovascular:  Negative for leg swelling.  Gastrointestinal:  Positive for heartburn. Negative for diarrhea, nausea and vomiting.  Genitourinary:  Negative for dysuria.  Musculoskeletal:  Positive for back pain and myalgias. Negative for falls, joint pain and neck pain.  Neurological:  Negative for dizziness, tingling, tremors, sensory change, speech change, focal weakness, weakness and headaches.  Psychiatric/Behavioral:  The patient does not have insomnia.       Objective:     Resp 16    Physical Exam Vitals and nursing note reviewed.  Constitutional:      General: He is awake. He is not in acute distress.    Appearance: Normal appearance. He is well-developed, well-groomed and overweight. He is not ill-appearing, toxic-appearing or diaphoretic.  HENT:     Head: Normocephalic and atraumatic.     Jaw: There is normal jaw occlusion.     Salivary Glands: Right salivary  gland is not diffusely enlarged. Left salivary gland is not diffusely enlarged.     Right Ear: Hearing and external ear normal.     Left Ear: Hearing and external ear normal.     Nose: Nose normal. No congestion or rhinorrhea.     Mouth/Throat:     Lips: Pink. No lesions.     Mouth: Mucous membranes are moist.     Pharynx: Oropharynx is clear.  Eyes:     General: Lids are normal. Vision grossly intact. Gaze aligned appropriately. No scleral icterus.       Right eye: No discharge.        Left eye: No discharge.     Extraocular Movements: Extraocular movements intact.     Conjunctiva/sclera: Conjunctivae normal.     Pupils: Pupils are equal, round, and reactive to light.  Neck:     Trachea: Trachea normal.  Cardiovascular:     Rate and Rhythm: Normal rate and regular rhythm.     Pulses: Normal pulses.  Pulmonary:     Effort: Pulmonary effort is normal. No respiratory distress.     Breath sounds: Normal breath sounds and air entry. No stridor or transmitted upper airway sounds. No wheezing.     Comments: Spoke full sentences without difficulty; no cough observed in exam room Abdominal:     General: Abdomen is protuberant.     Palpations: Abdomen is soft. There is no fluid wave.     Tenderness: There is no right CVA tenderness or left CVA tenderness.  Comments: Abdomen more protuberant today  Musculoskeletal:        General: No swelling, tenderness, deformity or signs of injury.     Cervical back: Normal range of motion and neck supple. No swelling, edema, deformity, erythema, signs of trauma, lacerations, rigidity, spasms, torticollis, tenderness or crepitus. No pain with movement, spinous process tenderness or muscular tenderness. Normal range of motion.     Thoracic back: No swelling, edema, deformity, signs of trauma, lacerations, spasms or tenderness. Decreased range of motion. No scoliosis.     Lumbar back: No swelling, edema, deformity, signs of trauma, lacerations, spasms or  tenderness. Decreased range of motion. No scoliosis.       Back:     Right lower leg: No edema.     Left lower leg: No edema.     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     Left side of head: No submandibular or preauricular adenopathy.     Cervical: No cervical adenopathy.     Right cervical: No superficial, deep or posterior cervical adenopathy.    Left cervical: No superficial, deep or posterior cervical adenopathy.  Skin:    General: Skin is warm and dry.     Capillary Refill: Capillary refill takes less than 2 seconds.     Coloration: Skin is not ashen, cyanotic, jaundiced, mottled, pale or sallow.     Findings: No abrasion, bruising, burn, erythema, signs of injury, laceration, lesion, petechiae, rash or wound.     Comments: Anterior face/neck visually inspected  Neurological:     General: No focal deficit present.     Mental Status: He is alert and oriented to person, place, and time. Mental status is at baseline.     GCS: GCS eye subscore is 4. GCS verbal subscore is 5. GCS motor subscore is 6.     Cranial Nerves: No cranial nerve deficit, dysarthria or facial asymmetry.     Sensory: No sensory deficit.     Motor: Motor function is intact. No weakness, tremor, atrophy, abnormal muscle tone or seizure activity.     Coordination: Coordination is intact. Coordination normal.     Gait: Gait is intact. Gait normal.     Comments: In/out of chair without difficulty; gait sure and steady in clinic; bilateral hand grasp equal 5/5  Psychiatric:        Attention and Perception: Attention and perception normal.        Mood and Affect: Mood and affect normal.        Speech: Speech normal.        Behavior: Behavior normal.  Behavior is cooperative.        Thought Content: Thought content normal.        Cognition and Memory: Cognition and memory normal.        Judgment: Judgment normal.   CLINICAL DATA:  Abdominal pain, worse in the epigastric region   EXAM: CT ABDOMEN AND PELVIS WITH CONTRAST   TECHNIQUE: Multidetector CT imaging of the abdomen and pelvis was performed using the standard protocol following bolus administration of intravenous contrast.   CONTRAST:  80mL OMNIPAQUE  IOHEXOL  350 MG/ML SOLN   COMPARISON:  None.   FINDINGS: Lower chest: There is minimal dependent subsegmental atelectasis in  the lung bases. The imaged heart is unremarkable.   Hepatobiliary: The liver is diffusely hypoattenuating. There are no focal lesions. The gallbladder is unremarkable. There is no biliary ductal dilatation.   Pancreas: Unremarkable.   Spleen: Unremarkable.   Adrenals/Urinary Tract: The adrenals are unremarkable.   Multiple low-density lesions in the kidneys likely reflects cysts. There are no suspicious lesions. There are no stones. There is no hydronephrosis or hydroureter. The bladder is decompressed but grossly unremarkable.   Stomach/Bowel: The stomach is unremarkable. There is no evidence of bowel obstruction. There is no abnormal bowel wall thickening or inflammatory change. The appendix is normal.   Vascular/Lymphatic: The abdominal aorta is nonaneurysmal. The major branch vessels are patent. The main portal and splenic veins are patent. There is no abdominal or pelvic lymphadenopathy.   Reproductive: The prostate and seminal vesicles are unremarkable.   Other: There is no ascites or free air.   Musculoskeletal: There is mild degenerative change in the imaged spine. There is no acute osseous abnormality or aggressive osseous lesion.   IMPRESSION: 1. No acute findings in the abdomen or pelvis. 2. Hepatic steatosis.     Electronically Signed   By: Maude Harry M.D.   On:  05/25/2021 11:05  rrative & Impression  CLINICAL DATA:  Low back pain   EXAM: LUMBAR SPINE - COMPLETE 4+ VIEW   COMPARISON:  11/24/2017   FINDINGS: Five lumbar type vertebral segments. Vertebral body heights and alignment are maintained. No fracture identified. Lumbar intervertebral disc spaces are relatively preserved. Mild disc height loss at T11-12, similar to prior. Minimal degenerative endplate changes. No significant lumbar facet arthrosis.   IMPRESSION: No significant degenerative changes of the lumbar spine.     Electronically Signed   By: Mabel Converse D.O.   On: 03/16/2021 11:00     No results found for any visits on 05/01/24.     Latest Reference Range & Units 05/25/21 07:30 05/30/21 09:00 11/03/21 10:50 06/13/22 11:43 08/24/22 13:00 11/27/22 11:35 12/05/22 11:30 05/31/23 11:32 09/27/23 00:00  Comprehensive metabolic panel with GFR  Rpt !          Sodium 134 - 144 mmol/L 139 140 142  136    141  Potassium 3.5 - 5.2 mmol/L 3.9 4.1 4.0  3.8    4.1  Chloride 96 - 106 mmol/L 104 103 101  101    102  CO2 20 - 29 mmol/L 26  22        Glucose 70 - 99 mg/dL 894 (H) 93 80  897 (H)    103 (H)  BUN 8 - 27 mg/dL 15 15 14  12    19   Creatinine 0.76 - 1.27 mg/dL 8.84 8.68 (H) 8.94  8.84    1.25  Calcium  8.6 - 10.2 mg/dL 9.6 9.3 9.3  9.4    9.5  Anion gap 5 - 15  9          BUN/Creatinine Ratio 10 - 24   11 13  10    15   eGFR >59 mL/min/1.73  61 79  71    64  Phosphorus 2.8 - 4.1 mg/dL  2.7 (L)   2.6 (L)    2.8  Alkaline Phosphatase 44 - 121 IU/L 64 84   76    81  Albumin 3.9 - 4.9 g/dL 4.6 4.9 (H)   4.6    4.6  Albumin/Globulin Ratio 1.2 - 2.2   1.9   1.8  Uric Acid 3.8 - 8.4 mg/dL  6.9   6.0    7.2  Lipase 11 - 51 U/L 26          AST 0 - 40 IU/L 22 21   21    22   ALT 0 - 44 IU/L 35 29   28    30   Total Protein 6.0 - 8.5 g/dL 7.6 7.5   7.2    7.5  Total Bilirubin 0.0 - 1.2 mg/dL 1.0 0.7   1.1    1.1  GGT 0 - 65 IU/L  25   18    23   GFR, Estimated >60 mL/min  >60          Estimated CHD Risk 0.0 - 1.0 times avg.  1.0   0.7    0.9  LDH 121 - 224 IU/L  191   174    167  Total CHOL/HDL Ratio 0.0 - 5.0 ratio  4.9   3.8    4.4  Cholesterol, Total 100 - 199 mg/dL  787 (H)   862    823  HDL Cholesterol >39 mg/dL  43   36 (L)    40  Triglycerides 0 - 149 mg/dL  812 (H)   831 (H)    853  VLDL Cholesterol Cal 5 - 40 mg/dL  34   29    26  LDL Chol Calc (NIH) 0 - 99 mg/dL  864 (H)   72    889 (H)  Iron 38 - 169 ug/dL  81   95    890  Vitamin D , 25-Hydroxy 30.0 - 100.0 ng/mL     9.4 (L) 32.5     Globulin, Total 1.5 - 4.5 g/dL  2.6   2.6    2.9  WBC 3.4 - 10.8 x10E3/uL  6.4   7.0    6.6  RBC 4.14 - 5.80 x10E6/uL  5.74   5.45    5.87 (H)  Hemoglobin 13.0 - 17.7 g/dL  82.6   83.4    82.2  HCT 37.5 - 51.0 %  51.4 (H)   49.6    53.7 (H)  MCV 79 - 97 fL  90   91    92  MCH 26.6 - 33.0 pg  30.1   30.3    30.2  MCHC 31.5 - 35.7 g/dL  66.2   66.6    66.9  RDW 11.6 - 15.4 %  12.7   12.8    12.9  Platelets 150 - 450 x10E3/uL  195   184    208  Neutrophils Not Estab. %  56   60    54  Immature Granulocytes Not Estab. %  0   0    1  NEUT# 1.4 - 7.0 x10E3/uL  3.6   4.2    3.7  Lymphs Abs 0.7 - 3.1 x10E3/uL  2.3   2.1    2.3  Monocytes Absolute 0.1 - 0.9 x10E3/uL  0.4   0.6    0.4  Basophils Absolute 0.0 - 0.2 x10E3/uL  0.0   0.0    0.0  Immature Grans (Abs) 0.0 - 0.1 x10E3/uL  0.0   0.0    0.0  Lymphs Not Estab. %  35   30    36  Monocytes Not Estab. %  7   8    7   Basos Not Estab. %  1   1    1   Eos Not Estab. %  1   1    1   EOS (ABSOLUTE) 0.0 - 0.4 x10E3/uL  0.1   0.1    0.1  POC Glucose 70 - 99 mg/dl        872 !   Hemoglobin A1C 4.8 - 5.6 %  6.0 (H) 5.8 (H) 6.0 (H) 5.9 (H)  5.9 (H)  5.8 (H)  Est. average glucose Bld gHb Est-mCnc mg/dL         879  TSH 9.549 - 4.500 uIU/mL  1.050   0.827    0.850  Thyroxine (T4) 4.5 - 12.0 ug/dL  5.4   5.1    5.7  Free Thyroxine Index 1.2 - 4.9   1.4   1.4    1.7  T3 Uptake Ratio 24 - 39 %  26   28    29   Prostate Specific  Ag, Serum 0.0 - 4.0 ng/mL  0.6   0.4    0.5  !: Data is abnormal (H): Data is abnormally high (L): Data is abnormally low Rpt: View report in Results Review for more information  The 10-year ASCVD risk score (Arnett DK, et al., 2019) is: 11.8%    Assessment & Plan:   Problem List Items Addressed This Visit   None Visit Diagnoses       Chronic pain of right knee    -  Primary     Right flank pain, chronic         Reviewed labs and previous imaging results with patient.  Arthritis noted spine.  Discussed heat/tylenol /movement/stretching typically helps arthritis.  But history of kidney lesions should follow up with PCM for repeat imaging as creatinine slowly increasing also.    Afebrile/ CVA nontender will treat arthritis at this time.  Patient given thermacare patch from clinic stock and tylenol  500mg  2 UD to take 1 tab daily with food to see if pain improved.  Exitcare handout arthritis and core strengthening back exercises.  Recommended weight loss.  Same day re-evaluation with a  provider if loss of bowel/bladder control, saddle paresthesias or arm/leg weakness.  Patient agreed with plan of care and had no further questions at this time.  No follow-ups on file.    Ellouise DELENA Hope, NP

## 2024-07-30 ENCOUNTER — Other Ambulatory Visit (HOSPITAL_COMMUNITY): Payer: Self-pay | Admitting: Gerontology

## 2024-07-30 DIAGNOSIS — R42 Dizziness and giddiness: Secondary | ICD-10-CM

## 2024-07-30 DIAGNOSIS — R519 Headache, unspecified: Secondary | ICD-10-CM

## 2024-08-05 ENCOUNTER — Ambulatory Visit (HOSPITAL_BASED_OUTPATIENT_CLINIC_OR_DEPARTMENT_OTHER)
Admission: RE | Admit: 2024-08-05 | Discharge: 2024-08-05 | Disposition: A | Discharge status: Home / Self Care | Payer: Self-pay | Source: Ambulatory Visit | Attending: Gerontology | Admitting: Gerontology

## 2024-08-05 ENCOUNTER — Ambulatory Visit (HOSPITAL_COMMUNITY): Payer: Self-pay

## 2024-08-05 DIAGNOSIS — R519 Headache, unspecified: Secondary | ICD-10-CM | POA: Diagnosis present

## 2024-08-05 DIAGNOSIS — R42 Dizziness and giddiness: Secondary | ICD-10-CM | POA: Diagnosis present

## 2024-09-19 ENCOUNTER — Encounter: Payer: Self-pay | Admitting: Registered Nurse

## 2024-09-19 MED ORDER — AMOXICILLIN-POT CLAVULANATE 875-125 MG PO TABS: 1.0000 | ORAL_TABLET | Freq: Two times a day (BID) | ORAL | 0 refills | Status: AC

## 2024-09-22 ENCOUNTER — Telehealth: Payer: Self-pay | Admitting: Registered Nurse

## 2024-09-22 DIAGNOSIS — J111 Influenza due to unidentified influenza virus with other respiratory manifestations: Secondary | ICD-10-CM

## 2024-09-22 NOTE — Telephone Encounter (Signed)
 Spoke with patient via telephone stated that cough improved with augmentin  and wants to return to work tomorrow.  Discussed mask wear if coughing/sneezing until symptoms resolved.  See RN Karene in clinic tomorrow if not feeling well at work clinic open 8a-5p.  NP returns Tuesday 9a.  Denied new symptoms or n/v/d/fever/chills in the past 48 hours.  A&Ox3 spoke full sentences without difficulty during 2 minute call mild nasal congestion no wheezing/cough/throat clearing audible during 2 minute call.  HR team notified cleared to return onsite with mask 09/22/24.  Patient to use normal call out procedures if wakes up sick/new or worse symptoms in am.  Patient agreed with plan of care and had no further questions at this time.

## 2024-09-22 NOTE — Telephone Encounter (Signed)
 Notified by supervisor patient had called out sick starting 17 Sep 2024 and returned onsite 19 Sep 2024 wearing mask and notified supervisor he had flu.  He had not seen medical provider prior to returning to work and left work early 6 Jan without seeing [employer].  He still appears ill per supervisor and clinic staff notified.  NP spoke with patient via telephone stated had been sweating but did not check temperature harsh cough tan bad tasting the past couple of days.  Denied wheezing but does get short of breath with activity.  Denied n/v/d or new symptoms today.  A&Ox3 spoke full sentences without difficulty some audible nasal congestion   Electronic Rx for augmentin  875mg  po BID x 10 days #20 RF0 to cover for sinusitis and pneumonia sent to his pharmacy of choice.  Discussed with patient covid/influenza/metapneumovirus/adenovirus/RSV circulating in high levels in community.  Typically lasting symptoms 10-14 days.  Mucous clear to yellow to green then done with viral illnesses Honey 1 tablespoon po q4h prn cough.  Patient refused Rx for Tessalon pearles 200mg  po q8h prn cough  Discussed hydrate with water to keep urine pale yellow clear and voiding every 2-4 hours while awake.  Patient may use normal saline nasal spray 2 sprays each nostril q2h wa as needed given 1 bottle from clinic stock today.  Continue flonase 50mcg 1 spray each nostril BID OTC.   Discussed post nasal drip triggering cough need to get it dried up.   Avoid triggers if possible.  Shower prior to bedtime after work and am prn congestion Call or return to clinic as needed if these symptoms worsen or fail to improve as anticipated e.g. brown opaque mucous/bloody mucous/chunky mucous, wheezing or dyspnea develop.   Exitcare handouts viral URI with cough, and sinus rinse sent to patient my chart.  Patient notified clinic closed and RN Karene returns 09/22/24 8a-5p and NP onsite Tuesday9a-12p.  Patient may contact me if concerns this weekend via my  chart or [email or web address]  Patient verbalized understanding of instructions, agreed with plan of care and had no further questions at this time.  HR and supervisor team notified excused absence 1/6-1/11/26 re-evaluation 09/21/2024

## 2024-09-23 ENCOUNTER — Ambulatory Visit: Payer: Self-pay | Admitting: Registered Nurse

## 2024-09-23 ENCOUNTER — Encounter: Payer: Self-pay | Admitting: Registered Nurse

## 2024-09-23 VITALS — BP 134/81 | HR 72 | Temp 96.6°F | Resp 16

## 2024-09-23 DIAGNOSIS — H6993 Unspecified Eustachian tube disorder, bilateral: Secondary | ICD-10-CM

## 2024-09-23 DIAGNOSIS — R42 Dizziness and giddiness: Secondary | ICD-10-CM

## 2024-09-23 NOTE — Patient Instructions (Signed)
 Dizziness Dizziness is a common problem. It makes you feel unsteady or light-headed. You may feel like you're about to faint. Dizziness can lead to getting hurt if you stumble or fall. It's more common to feel dizzy if you're an older adult. Many things can cause you to feel dizzy. These include: Medicines. Dehydration. This is when there's not enough water in your body. Illness. Follow these instructions at home: Eating and drinking  Drink enough fluid to keep your pee (urine) pale yellow. This helps keep you from getting dehydrated. Try to drink more clear fluids, such as water. Do not drink alcohol. Try to limit how much caffeine you take in. Try to limit how much salt, also called sodium, you take in. Activity Try not to make quick movements. Stand up slowly from sitting in a chair. Steady yourself until you feel okay. In the morning, first sit up on the side of the bed. When you feel okay, hold onto something and slowly stand up. Do this until you know that your balance is okay. If you need to stand in one place for a long time, move your legs often. Tighten and relax the muscles in your legs while you're standing. Do not drive or use machines if you feel dizzy. Avoid bending down if you feel dizzy. Place items in your home so you can reach them without leaning over. Lifestyle Do not smoke, vape, or use products with nicotine or tobacco in them. If you need help quitting, talk with your health care provider. Try to lower your stress level. You can do this by using methods like yoga or meditation. Talk with your provider if you need help. General instructions Watch your dizziness for any changes. Take your medicines only as told by your provider. Talk with your provider if you think you're dizzy because of a medicine you're taking. Tell a friend or a family member that you're feeling dizzy. If they spot any changes in your behavior, have them call your provider. Contact a health care  provider if: Your dizziness doesn't go away, or you have new symptoms. Your dizziness gets worse. You feel like you may vomit. You have trouble hearing. You have a fever. You have neck pain or a stiff neck. You fall or get hurt. Get help right away if: You vomit each time you eat or drink. You have watery poop and can't eat or drink. You have trouble talking, walking, swallowing, or using your arms, hands, or legs. You feel very weak. You're bleeding. You're not thinking clearly, or you have trouble forming sentences. A friend or family member may spot this. Your vision changes, or you get a very bad headache. These symptoms may be an emergency. Call 911 right away. Do not wait to see if the symptoms will go away. Do not drive yourself to the hospital. This information is not intended to replace advice given to you by your health care provider. Make sure you discuss any questions you have with your health care provider. Document Revised: 05/31/2023 Document Reviewed: 10/12/2022 Elsevier Patient Education  2024 [employer]Eustachian Tube Dysfunction  Eustachian tube dysfunction refers to a condition in which a blockage develops in the narrow passage that connects the middle ear to the back of the nose (eustachian tube). The eustachian tube regulates air pressure in the middle ear by letting air move between the ear and nose. It also helps to drain fluid from the middle ear space. Eustachian tube dysfunction can affect one or both  ears. When the eustachian tube does not function properly, air pressure, fluid, or both can build up in the middle ear. What are the causes? This condition occurs when the eustachian tube becomes blocked or cannot open normally. Common causes of this condition include: Ear infections. Colds and other infections that affect the nose, mouth, and throat (upper respiratory tract). Allergies. Irritation from cigarette smoke. Irritation from stomach acid coming up  into the esophagus (gastroesophageal reflux). The esophagus is the part of the body that moves food from the mouth to the stomach. Sudden changes in air pressure, such as from descending in an airplane or scuba diving. Abnormal growths in the nose or throat, such as: Growths that line the nose (nasal polyps). Abnormal growth of cells (tumors). Enlarged tissue at the back of the throat (adenoids). What increases the risk? You are more likely to develop this condition if: You smoke. You are overweight. You are a child who has: Certain birth defects of the mouth, such as cleft palate. Large tonsils or adenoids. What are the signs or symptoms? Common symptoms of this condition include: A feeling of fullness in the ear. Ear pain. Clicking or popping noises in the ear. Ringing in the ear (tinnitus). Hearing loss. Loss of balance. Dizziness. Symptoms may get worse when the air pressure around you changes, such as when you travel to an area of high elevation, fly on an airplane, or go scuba diving. How is this diagnosed? This condition may be diagnosed based on: Your symptoms. A physical exam of your ears, nose, and throat. Tests, such as those that measure: The movement of your eardrum. Your hearing (audiometry). How is this treated? Treatment depends on the cause and severity of your condition. In mild cases, you may relieve your symptoms by moving air into your ears. This is called popping the ears. In more severe cases, or if you have symptoms of fluid in your ears, treatment may include: Medicines to relieve congestion (decongestants). Medicines that treat allergies (antihistamines). Nasal sprays or ear drops that contain medicines that reduce swelling (steroids). A procedure to drain the fluid in your eardrum. In this procedure, a small tube may be placed in the eardrum to: Drain the fluid. Restore the air in the middle ear space. A procedure to insert a balloon device through  the nose to inflate the opening of the eustachian tube (balloon dilation). Follow these instructions at home: Lifestyle Do not do any of the following until your health care provider approves: Travel to high altitudes. Fly in airplanes. Work in a estate agent or room. Scuba dive. Do not use any products that contain nicotine or tobacco. These products include cigarettes, chewing tobacco, and vaping devices, such as e-cigarettes. If you need help quitting, ask your health care provider. Keep your ears dry. Wear fitted earplugs during showering and bathing. Dry your ears completely after. General instructions Take over-the-counter and prescription medicines only as told by your health care provider. Use techniques to help pop your ears as recommended by your health care provider. These may include: Chewing gum. Yawning. Frequent, forceful swallowing. Closing your mouth, holding your nose closed, and gently blowing as if you are trying to blow air out of your nose. Keep all follow-up visits. This is important. Contact a health care provider if: Your symptoms do not go away after treatment. Your symptoms come back after treatment. You are unable to pop your ears. You have: A fever. Pain in your ear. Pain in your head or neck.  Fluid draining from your ear. Your hearing suddenly changes. You become very dizzy. You lose your balance. Get help right away if: You have a sudden, severe increase in any of your symptoms. Summary Eustachian tube dysfunction refers to a condition in which a blockage develops in the eustachian tube. It can be caused by ear infections, allergies, inhaled irritants, or abnormal growths in the nose or throat. Symptoms may include ear pain or fullness, hearing loss, or ringing in the ears. Mild cases are treated with techniques to unblock the ears, such as yawning or chewing gum. More severe cases are treated with medicines or procedures. This information is  not intended to replace advice given to you by your health care provider. Make sure you discuss any questions you have with your health care provider. Document Revised: 11/08/2020 Document Reviewed: 11/08/2020 Elsevier Patient Education  2024 [employer]Dehydration, Adult Dehydration is a condition in which there is not enough water or other fluids in the body. This happens when a person loses more fluids than they take in. Important organs, such as the kidneys, brain, and heart, cannot function without a proper amount of fluids. Any loss of fluids from the body can lead to dehydration. Dehydration can be mild, moderate, or severe. It should be treated right away to prevent it from becoming severe. What are the causes? Dehydration may be caused by: Health conditions, such as diarrhea, vomiting, fever, infection, or sweating or urinating a lot. Not drinking enough fluids. Certain medicines, such as medicines that remove excess fluid from the body (diuretics). Lack of safe drinking water. Not being able to get enough water and food. What increases the risk? The following factors may make you more likely to develop this condition: Having a long-term (chronic) illness that has not been treated properly, such as diabetes, heart disease, or kidney disease. Being 11 years of age or older. Having a disability. Living in a place that is high in altitude, where thinner, drier air causes more fluid loss. Doing exercises that put stress on your body for a long time (endurance sports). Being active in a hot climate. What are the signs or symptoms? Symptoms of dehydration depend on how severe it is. Mild or moderate dehydration Thirst. Dry lips or dry mouth. Dizziness or light-headedness. Muscle cramps. Dark urine. Urine may be the color of tea. Less urine or tears produced than usual. Headache. Severe dehydration Changes in skin. Your skin may be cold and clammy, blotchy, or pale. Your skin also  may not return to normal after being lightly pinched and released. Little or no tears, urine, or sweat. Rapid breathing and low blood pressure. Your pulse may be weak or may be faster than 100 beats per minute when you are sitting still. Other changes, such as: Feeling very thirsty. Sunken eyes. Cold hands and feet. Confusion. Being very tired (lethargic) or having trouble waking from sleep. Short-term weight loss. Loss of consciousness. How is this diagnosed? This condition is diagnosed based on your symptoms and a physical exam. You may have blood and urine tests to help confirm the diagnosis. How is this treated? Treatment for this condition depends on how severe it is. Treatment should be started right away. Do not wait until dehydration becomes severe. Severe dehydration is an emergency and needs to be treated in a hospital. Mild or moderate dehydration can be treated at home. You may be asked to: Drink more fluids. Drink an oral rehydration solution (ORS). This drink restores fluids, salts, and minerals  in the blood (electrolytes). Stop any activities that caused dehydration, such as exercise. Cool off with cool compresses, cool mist, or cool fluids, if heat or too much sweat caused your condition. Take medicine to treat fever, if fever caused your condition. Take medicine to treat nausea and diarrhea, if vomiting or diarrhea caused your condition. Severe dehydration can be treated: With IV fluids. By correcting abnormal levels of electrolytes in your body. By treating the underlying cause of dehydration. Follow these instructions at home: Oral rehydration solution If told by your health care provider, drink an ORS: Make an ORS by following instructions on the package. Start by drinking small amounts, about  cup (120 mL) every 5-10 minutes. Slowly increase how much you drink until you have taken the amount recommended by your health care provider.  Eating and drinking  Drink  enough clear fluid to keep your urine pale yellow. If you were told to drink an ORS, finish the ORS first and then start slowly drinking other clear fluids. Drink fluids such as: Water. Do not drink only water. Doing that can lead to hyponatremia, which is having too little salt (sodium) in the body. Water from ice chips you suck on. Diluted fruit juice. This is fruit juice that you have added water to. Low-calorie sports drinks. Eat foods that contain a healthy balance of electrolytes, such as bananas, oranges, potatoes, tomatoes, and spinach. Do not drink alcohol. Avoid the following: Drinks that contain a lot of sugar. These include high-calorie sports drinks, fruit juice that is not diluted, and soda. Caffeine. Foods that are greasy or contain a lot of fat or sugar. General instructions Take over-the-counter and prescription medicines only as told by your health care provider. Do not take sodium tablets. Doing that can lead to having too much sodium in the body (hypernatremia). Return to your normal activities as told by your health care provider. Ask your health care provider what activities are safe for you. Keep all follow-up visits. Your health care provider may need to check your progress and suggest new ways to treat your condition. Contact a health care provider if: You have muscle cramps, pain, or discomfort, such as: Pain in your abdomen and the pain gets worse or stays in one area. Stiff neck. You have a rash. You are more irritable than usual. You are sleepier or have a harder time waking. You feel weak or dizzy. You feel very thirsty. Get help right away if: You have symptoms of severe dehydration. You vomit every time you eat or drink. Your vomiting gets worse, does not go away, or includes blood or green matter (bile). You are getting treatment but symptoms are getting worse. You have a fever. You have a severe headache. You have: Diarrhea that gets worse or does  not go away. Blood in your stool. This may cause stool to look black and tarry. Not urinating, or urinating only a small amount of very dark urine, within 6-8 hours. You have trouble breathing. These symptoms may be an emergency. Get help right away. Do not wait to see if the symptoms will go away. Do not drive yourself to the hospital. Call 911. This information is not intended to replace advice given to you by your health care provider. Make sure you discuss any questions you have with your health care provider. Document Revised: 03/27/2022 Document Reviewed: 03/27/2022 Elsevier Patient Education  2024 [employer].

## 2024-09-23 NOTE — Progress Notes (Signed)
 "  Established Patient Office Visit  Subjective   Patient ID: Jason Swanson, male    DOB: [birth year]  Age: 69 y.o. MRN: [id number]  Chief Complaint  Patient presents with   Dizziness    Patient reported intermittent dizziness yesterday and today with certain positions cough improved and overall feeling better was sick with flu last week    68y/o established male returned to work yesterday after having influenza last week.  Started course of augmentin  for sinusitis/pneumonia coverage 09/18/24 cough improved and feeling much better except for occasional dizziness with certain positions/prolonged standing without movement concentrating on product.  Denied fever/chills/n/v/d past 72 hours.  Urine at times dark yellow then he increases water intake and it becomes pale yellow.  PO intake without difficulty.      Review of Systems  Constitutional:  Negative for chills, diaphoresis, fever and malaise/fatigue.  HENT:  Positive for congestion. Negative for ear discharge, ear pain, hearing loss, nosebleeds, sinus pain, sore throat and tinnitus.   Eyes:  Negative for blurred vision, double vision, photophobia, pain, discharge and redness.  Respiratory:  Positive for cough and sputum production. Negative for hemoptysis, shortness of breath, wheezing and stridor.   Cardiovascular:  Negative for chest pain and palpitations.  Gastrointestinal:  Negative for diarrhea, nausea and vomiting.  Genitourinary:  Negative for dysuria.  Musculoskeletal:  Negative for falls, myalgias and neck pain.  Skin:  Negative for itching and rash.  Neurological:  Negative for dizziness, tingling, tremors, speech change, focal weakness, seizures, loss of consciousness, weakness and headaches.      Objective:     There were no vitals taken for this visit.   Physical Exam Vitals and nursing note reviewed.  Constitutional:      General: He is awake. He is not in acute distress.    Appearance: Normal appearance. He  is well-developed, well-groomed and overweight. He is not ill-appearing, toxic-appearing or diaphoretic.  HENT:     Head: Normocephalic and atraumatic.     Jaw: There is normal jaw occlusion.     Salivary Glands: Right salivary gland is not diffusely enlarged or tender. Left salivary gland is not diffusely enlarged or tender.     Right Ear: Hearing and external ear normal. No decreased hearing noted. No laceration, drainage, swelling or tenderness. A middle ear effusion is present. There is no impacted cerumen. No foreign body. No mastoid tenderness. No PE tube. No hemotympanum. Tympanic membrane is not injected, scarred, perforated or erythematous.     Left Ear: Hearing and external ear normal. No decreased hearing noted. No laceration, drainage, swelling or tenderness. A middle ear effusion is present. There is no impacted cerumen. No foreign body. No mastoid tenderness. No PE tube. No hemotympanum. Tympanic membrane is not injected, scarred, perforated or erythematous.     Ears:     Comments: Bilateral TMs intact air fluid level clear; no debris bilateral auditory canals    Nose: Nose normal. No nasal tenderness.     Mouth/Throat:     Comments: Patient wearing surgical mask in clinic Eyes:     General: Lids are normal. Vision grossly intact. Gaze aligned appropriately. Allergic shiner present. No scleral icterus.       Right eye: No discharge.        Left eye: No discharge.     Extraocular Movements: Extraocular movements intact.     Conjunctiva/sclera: Conjunctivae normal.     Pupils: Pupils are equal, round, and reactive to light.  Neck:  Trachea: Trachea normal.  Cardiovascular:     Rate and Rhythm: Normal rate and regular rhythm.     Pulses: Normal pulses.          Radial pulses are 2+ on the right side and 2+ on the left side.     Heart sounds: Normal heart sounds, S1 normal and S2 normal.  Pulmonary:     Effort: Pulmonary effort is normal. No respiratory distress.     Breath  sounds: Normal breath sounds and air entry. No stridor, decreased air movement or transmitted upper airway sounds. No decreased breath sounds, wheezing, rhonchi or rales.     Comments: Spoke full sentences without difficulty; no cough observed in exam room; BBS CTA Abdominal:     General: Abdomen is flat.  Musculoskeletal:        General: Normal range of motion.     Right hand: Normal strength. Normal capillary refill.     Left hand: Normal strength. Normal capillary refill.     Cervical back: Normal range of motion and neck supple. No swelling, edema, deformity, erythema, signs of trauma, lacerations, rigidity, torticollis, tenderness or crepitus. No pain with movement. Normal range of motion.     Thoracic back: No swelling, edema, deformity, signs of trauma, lacerations or tenderness. Normal range of motion.  Lymphadenopathy:     Head:     Right side of head: No submental, submandibular, tonsillar, preauricular, posterior auricular or occipital adenopathy.     Left side of head: No submental, submandibular, tonsillar, preauricular, posterior auricular or occipital adenopathy.     Cervical: No cervical adenopathy.     Right cervical: No superficial, deep or posterior cervical adenopathy.    Left cervical: No superficial, deep or posterior cervical adenopathy.  Skin:    General: Skin is warm and dry.     Capillary Refill: Capillary refill takes less than 2 seconds.     Coloration: Skin is not ashen, cyanotic, jaundiced, mottled, pale or sallow.     Findings: No abrasion, abscess, acne, bruising, burn, ecchymosis, erythema, signs of injury, laceration, lesion, petechiae, rash or wound.     Comments: Anterior face/neck/hands visually inspected  Neurological:     General: No focal deficit present.     Mental Status: He is alert and oriented to person, place, and time. Mental status is at baseline.     GCS: GCS eye subscore is 4. GCS verbal subscore is 5. GCS motor subscore is 6.     Cranial  Nerves: Cranial nerves 2-12 are intact. No cranial nerve deficit, dysarthria or facial asymmetry.     Sensory: Sensation is intact.     Motor: Motor function is intact. No weakness, tremor, abnormal muscle tone or seizure activity.     Coordination: Coordination is intact. Coordination normal.     Gait: Gait is intact. Gait normal.     Comments: In/out of chair without difficulty; gait sure and steady in clinic; bilateral hand grasp equal 5/5  Psychiatric:        Attention and Perception: Attention and perception normal.        Mood and Affect: Mood and affect normal.        Speech: Speech normal.        Behavior: Behavior normal. Behavior is cooperative.        Thought Content: Thought content normal.        Cognition and Memory: Cognition and memory normal.        Judgment: Judgment normal.  No results found for any visits on 09/23/24.    The 10-year ASCVD risk score (Arnett DK, et al., 2019) is: 11.8%    Assessment & Plan:   Problem List Items Addressed This Visit   None Visit Diagnoses       Postural dizziness    -  Primary     Eustachian tube dysfunction, bilateral         Discussed with patient dizziness could be related to fluid in ears or dehydration at times based on urine color.  Increase intake of water to keep urine pale yellow clear and voiding every 2-4 hours.  OTC medications can also have dizziness as side effect   Finish augmentin  875mg  po BID x 10 days started last week.  Follow up re-evaluation if cough worsening this week.  Return to clinic if dizziness duration or times per day increases.  Consider meclizine  25mg  po QID OTC.  No evidence of invasive bacterial infection, non toxic and well hydrated.  I do not see where any further testing or imaging is necessary at this time.   I will suggest supportive care, rest, good hygiene and encourage the patient to take adequate fluids.  The patient is to return to clinic or EMERGENCY ROOM if symptoms worsen or  change significantly e.g. ear pain, fever, purulent discharge from ears or bleeding.  Exitcare handout on dizziness and eustachian tube dysfunction printed and given to patient.  Discussed with patient post nasal drip irritates throat/causes swelling blocks eustachian tubes from draining and fluid fills up middle ear.  Bacteria/viruses can grow in fluid and with moving head tube compressed and increases pressure in tube/ear worsening pain.  Studies show will take 30 days for fluid to resolve after post nasal drip controlled with nasal steroid/antihistamine. Antibiotics and steroids do not speed up fluid removal.  Patient verbalized agreement and understanding of treatment plan and had no further questions at this time.   Return if symptoms worsen or fail to improve.    Ellouise DELENA Hope, NP  "

## 2024-09-23 NOTE — Telephone Encounter (Signed)
 Patient seen in clinic today see office note  Having some dizziness with head position changes yesterday and today

## 2024-09-25 NOTE — Telephone Encounter (Signed)
 Patient reported feeling better today still an occasional dizziness but less frequent than earlier this week.  Discussed continue to push water intake/hydrate and follow up for re-evaluation if not continuing to improve or new/worsening symptoms.  Patient A&Ox3 spoke full sentences without difficulty gait sure and steady in workcenter respirations even and unlabored RA
